# Patient Record
Sex: Male | Born: 1945 | Race: White | Hispanic: No | Marital: Married
Health system: Southern US, Community
[De-identification: ages and names within clinical notes are randomized; demographics above are authoritative.]

---

## 2004-11-09 ENCOUNTER — Emergency Department: Payer: Self-pay | Admitting: Emergency Medicine

## 2005-08-24 ENCOUNTER — Emergency Department: Payer: Self-pay | Admitting: Emergency Medicine

## 2005-08-24 ENCOUNTER — Other Ambulatory Visit: Payer: Self-pay

## 2006-06-04 ENCOUNTER — Inpatient Hospital Stay: Payer: Self-pay | Admitting: Internal Medicine

## 2006-06-04 ENCOUNTER — Other Ambulatory Visit: Payer: Self-pay

## 2006-07-25 ENCOUNTER — Emergency Department: Payer: Self-pay | Admitting: Emergency Medicine

## 2006-08-28 ENCOUNTER — Emergency Department: Payer: Self-pay | Admitting: Emergency Medicine

## 2006-09-13 ENCOUNTER — Inpatient Hospital Stay: Payer: Self-pay | Admitting: Internal Medicine

## 2006-09-13 ENCOUNTER — Other Ambulatory Visit: Payer: Self-pay

## 2006-09-15 ENCOUNTER — Other Ambulatory Visit: Payer: Self-pay

## 2006-11-15 ENCOUNTER — Other Ambulatory Visit: Payer: Self-pay

## 2006-11-15 ENCOUNTER — Inpatient Hospital Stay: Payer: Self-pay | Admitting: Internal Medicine

## 2007-10-05 ENCOUNTER — Emergency Department: Payer: Self-pay | Admitting: Emergency Medicine

## 2008-06-16 ENCOUNTER — Emergency Department: Payer: Self-pay | Admitting: Emergency Medicine

## 2009-01-22 ENCOUNTER — Inpatient Hospital Stay: Payer: Self-pay | Admitting: Internal Medicine

## 2009-04-21 ENCOUNTER — Inpatient Hospital Stay: Payer: Self-pay | Admitting: Internal Medicine

## 2009-05-14 ENCOUNTER — Ambulatory Visit: Payer: Self-pay | Admitting: Gastroenterology

## 2009-06-18 ENCOUNTER — Ambulatory Visit: Payer: Self-pay | Admitting: Gastroenterology

## 2009-07-03 ENCOUNTER — Ambulatory Visit: Payer: Self-pay | Admitting: Cardiology

## 2009-07-06 ENCOUNTER — Ambulatory Visit: Payer: Self-pay | Admitting: Cardiology

## 2009-08-16 ENCOUNTER — Ambulatory Visit: Payer: Self-pay | Admitting: Otolaryngology

## 2009-08-21 ENCOUNTER — Ambulatory Visit: Payer: Self-pay | Admitting: Ophthalmology

## 2009-08-27 ENCOUNTER — Inpatient Hospital Stay: Payer: Self-pay | Admitting: Internal Medicine

## 2009-09-13 ENCOUNTER — Ambulatory Visit: Payer: Self-pay | Admitting: Family

## 2009-10-11 ENCOUNTER — Ambulatory Visit: Payer: Self-pay | Admitting: Ophthalmology

## 2009-10-11 ENCOUNTER — Ambulatory Visit: Payer: Self-pay | Admitting: Family

## 2009-10-16 ENCOUNTER — Ambulatory Visit: Payer: Self-pay | Admitting: Ophthalmology

## 2009-11-30 ENCOUNTER — Inpatient Hospital Stay: Payer: Self-pay | Admitting: *Deleted

## 2010-03-15 ENCOUNTER — Emergency Department: Payer: Self-pay | Admitting: Unknown Physician Specialty

## 2010-04-12 ENCOUNTER — Ambulatory Visit: Payer: Self-pay | Admitting: Family

## 2010-08-10 ENCOUNTER — Inpatient Hospital Stay: Payer: Self-pay | Admitting: *Deleted

## 2011-04-08 ENCOUNTER — Inpatient Hospital Stay: Payer: Self-pay | Admitting: Internal Medicine

## 2011-04-08 LAB — PRO B NATRIURETIC PEPTIDE: B-Type Natriuretic Peptide: 3289 pg/mL — ABNORMAL HIGH (ref 0–125)

## 2011-04-08 LAB — CK TOTAL AND CKMB (NOT AT ARMC)
CK, Total: 102 U/L (ref 35–232)
CK, Total: 87 U/L (ref 35–232)
CK-MB: 2.8 ng/mL (ref 0.5–3.6)
CK-MB: 2.6 ng/mL (ref 0.5–3.6)

## 2011-04-08 LAB — TROPONIN I: Troponin-I: 0.03 ng/mL

## 2011-04-08 LAB — APTT: Activated PTT: 36 secs — ABNORMAL HIGH (ref 23.6–35.9)

## 2011-04-08 LAB — COMPREHENSIVE METABOLIC PANEL
Albumin: 3.3 g/dL — ABNORMAL LOW (ref 3.4–5.0)
Alkaline Phosphatase: 153 U/L — ABNORMAL HIGH (ref 50–136)
BUN: 61 mg/dL — ABNORMAL HIGH (ref 7–18)
Co2: 19 mmol/L — ABNORMAL LOW (ref 21–32)
Creatinine: 2.11 mg/dL — ABNORMAL HIGH (ref 0.60–1.30)
EGFR (Non-African Amer.): 33 — ABNORMAL LOW
Glucose: 123 mg/dL — ABNORMAL HIGH (ref 65–99)
Osmolality: 300 (ref 275–301)
Potassium: 4.2 mmol/L (ref 3.5–5.1)
SGOT(AST): 62 U/L — ABNORMAL HIGH (ref 15–37)
Sodium: 141 mmol/L (ref 136–145)
Total Protein: 7.2 g/dL (ref 6.4–8.2)

## 2011-04-08 LAB — CBC
MCHC: 33.6 g/dL (ref 32.0–36.0)
MCV: 99 fL (ref 80–100)
RBC: 3.19 10*6/uL — ABNORMAL LOW (ref 4.40–5.90)
RDW: 17.8 % — ABNORMAL HIGH (ref 11.5–14.5)
WBC: 4.5 10*3/uL (ref 3.8–10.6)

## 2011-04-08 LAB — TSH: Thyroid Stimulating Horm: 2.21 u[IU]/mL

## 2011-04-08 LAB — PROTIME-INR: INR: 1

## 2011-04-09 LAB — COMPREHENSIVE METABOLIC PANEL
Alkaline Phosphatase: 129 U/L (ref 50–136)
Anion Gap: 12 (ref 7–16)
Co2: 18 mmol/L — ABNORMAL LOW (ref 21–32)
Creatinine: 1.72 mg/dL — ABNORMAL HIGH (ref 0.60–1.30)
Glucose: 82 mg/dL (ref 65–99)
Osmolality: 298 (ref 275–301)
Potassium: 4.3 mmol/L (ref 3.5–5.1)
SGOT(AST): 55 U/L — ABNORMAL HIGH (ref 15–37)
Sodium: 142 mmol/L (ref 136–145)

## 2011-04-09 LAB — CBC WITH DIFFERENTIAL/PLATELET
Basophil #: 0 10*3/uL (ref 0.0–0.1)
Eosinophil %: 7 %
Lymphocyte %: 29.2 %
MCH: 33.5 pg (ref 26.0–34.0)
MCHC: 34.3 g/dL (ref 32.0–36.0)
Monocyte #: 0.5 10*3/uL (ref 0.0–0.7)
Neutrophil %: 51.3 %
RDW: 17.8 % — ABNORMAL HIGH (ref 11.5–14.5)
WBC: 4.3 10*3/uL (ref 3.8–10.6)

## 2011-04-09 LAB — HEMOGLOBIN A1C: Hemoglobin A1C: 6.4 % — ABNORMAL HIGH (ref 4.2–6.3)

## 2011-04-09 LAB — LIPID PANEL
Cholesterol: 115 mg/dL (ref 0–200)
HDL Cholesterol: 21 mg/dL — ABNORMAL LOW (ref 40–60)

## 2011-05-28 ENCOUNTER — Inpatient Hospital Stay: Payer: Self-pay | Admitting: Internal Medicine

## 2011-05-28 LAB — URINALYSIS, COMPLETE
Bilirubin,UR: NEGATIVE
Glucose,UR: NEGATIVE mg/dL (ref 0–75)
Hyaline Cast: 7
Ketone: NEGATIVE
Leukocyte Esterase: NEGATIVE
Nitrite: NEGATIVE
Ph: 6 (ref 4.5–8.0)
Specific Gravity: 1.006 (ref 1.003–1.030)
Squamous Epithelial: 3
WBC UR: 1 /HPF (ref 0–5)

## 2011-05-28 LAB — CBC
HCT: 31.5 % — ABNORMAL LOW (ref 40.0–52.0)
HGB: 10.5 g/dL — ABNORMAL LOW (ref 13.0–18.0)
MCH: 34.2 pg — ABNORMAL HIGH (ref 26.0–34.0)
Platelet: 109 10*3/uL — ABNORMAL LOW (ref 150–440)
RBC: 3.06 10*6/uL — ABNORMAL LOW (ref 4.40–5.90)
WBC: 6.5 10*3/uL (ref 3.8–10.6)

## 2011-05-28 LAB — BASIC METABOLIC PANEL
Anion Gap: 11 (ref 7–16)
BUN: 89 mg/dL — ABNORMAL HIGH (ref 7–18)
Calcium, Total: 8.6 mg/dL (ref 8.5–10.1)
Co2: 22 mmol/L (ref 21–32)
EGFR (African American): 22 — ABNORMAL LOW
Glucose: 71 mg/dL (ref 65–99)
Osmolality: 305 (ref 275–301)

## 2011-05-28 LAB — PROTIME-INR
INR: 1.1
Prothrombin Time: 14.6 secs (ref 11.5–14.7)

## 2011-05-29 LAB — BASIC METABOLIC PANEL
Anion Gap: 11 (ref 7–16)
Calcium, Total: 8.6 mg/dL (ref 8.5–10.1)
Co2: 21 mmol/L (ref 21–32)
Creatinine: 3.13 mg/dL — ABNORMAL HIGH (ref 0.60–1.30)
EGFR (African American): 23 — ABNORMAL LOW
EGFR (Non-African Amer.): 20 — ABNORMAL LOW
Osmolality: 309 (ref 275–301)
Sodium: 141 mmol/L (ref 136–145)

## 2011-05-29 LAB — TROPONIN I: Troponin-I: 0.02 ng/mL

## 2011-05-30 LAB — COMPREHENSIVE METABOLIC PANEL
Albumin: 2.8 g/dL — ABNORMAL LOW (ref 3.4–5.0)
Alkaline Phosphatase: 165 U/L — ABNORMAL HIGH (ref 50–136)
BUN: 64 mg/dL — ABNORMAL HIGH (ref 7–18)
Calcium, Total: 8.6 mg/dL (ref 8.5–10.1)
Co2: 18 mmol/L — ABNORMAL LOW (ref 21–32)
Creatinine: 2.46 mg/dL — ABNORMAL HIGH (ref 0.60–1.30)
Glucose: 189 mg/dL — ABNORMAL HIGH (ref 65–99)
Potassium: 4.3 mmol/L (ref 3.5–5.1)
Total Protein: 7.2 g/dL (ref 6.4–8.2)

## 2011-07-27 ENCOUNTER — Emergency Department: Payer: Self-pay | Admitting: Internal Medicine

## 2011-07-27 LAB — CBC
HGB: 10.6 g/dL — ABNORMAL LOW (ref 13.0–18.0)
MCH: 33.8 pg (ref 26.0–34.0)
Platelet: 120 10*3/uL — ABNORMAL LOW (ref 150–440)
RBC: 3.14 10*6/uL — ABNORMAL LOW (ref 4.40–5.90)
WBC: 5 10*3/uL (ref 3.8–10.6)

## 2011-07-27 LAB — COMPREHENSIVE METABOLIC PANEL
Alkaline Phosphatase: 202 U/L — ABNORMAL HIGH (ref 50–136)
BUN: 13 mg/dL (ref 7–18)
Chloride: 105 mmol/L (ref 98–107)
Co2: 32 mmol/L (ref 21–32)
Creatinine: 1.22 mg/dL (ref 0.60–1.30)
EGFR (African American): >60
EGFR (Non-African Amer.): >60
Glucose: 105 mg/dL — ABNORMAL HIGH (ref 65–99)
SGOT(AST): 37 U/L (ref 15–37)
SGPT (ALT): 21 U/L
Total Protein: 7.4 g/dL (ref 6.4–8.2)

## 2011-07-27 LAB — TROPONIN I: Troponin-I: 0.04 ng/mL

## 2011-08-28 ENCOUNTER — Inpatient Hospital Stay: Payer: Self-pay | Admitting: Internal Medicine

## 2011-08-28 LAB — PRO B NATRIURETIC PEPTIDE: B-Type Natriuretic Peptide: 1992 pg/mL — ABNORMAL HIGH

## 2011-08-28 LAB — CBC
HCT: 32.4 % — ABNORMAL LOW (ref 40.0–52.0)
HGB: 11.1 g/dL — ABNORMAL LOW (ref 13.0–18.0)
MCH: 33.1 pg (ref 26.0–34.0)
MCHC: 34.2 g/dL (ref 32.0–36.0)
MCV: 97 fL (ref 80–100)
RDW: 14.7 % — ABNORMAL HIGH (ref 11.5–14.5)

## 2011-08-28 LAB — BASIC METABOLIC PANEL WITH GFR
Anion Gap: 6 — ABNORMAL LOW
BUN: 23 mg/dL — ABNORMAL HIGH
Calcium, Total: 9.3 mg/dL
Chloride: 105 mmol/L
Co2: 29 mmol/L
Creatinine: 1.42 mg/dL — ABNORMAL HIGH
EGFR (African American): 58 — ABNORMAL LOW
EGFR (Non-African Amer.): 50 — ABNORMAL LOW
Glucose: 107 mg/dL — ABNORMAL HIGH
Osmolality: 284
Potassium: 4.5 mmol/L
Sodium: 140 mmol/L

## 2011-08-28 LAB — CK TOTAL AND CKMB (NOT AT ARMC)
CK, Total: 36 U/L
CK, Total: 41 U/L (ref 35–232)
CK-MB: 1.1 ng/mL (ref 0.5–3.6)
CK-MB: 1 ng/mL
CK-MB: 1.5 ng/mL (ref 0.5–3.6)

## 2011-08-28 LAB — TROPONIN I
Troponin-I: <0.02 ng/mL
Troponin-I: <0.02 ng/mL

## 2011-08-29 LAB — CBC WITH DIFFERENTIAL/PLATELET
Basophil #: 0 10*3/uL (ref 0.0–0.1)
Basophil %: 0.7 %
Eosinophil %: 6.3 %
HCT: 33.3 % — ABNORMAL LOW (ref 40.0–52.0)
HGB: 11 g/dL — ABNORMAL LOW (ref 13.0–18.0)
Lymphocyte #: 1.4 10*3/uL (ref 1.0–3.6)
Lymphocyte %: 25.4 %
MCH: 32.2 pg (ref 26.0–34.0)
MCV: 98 fL (ref 80–100)
Monocyte #: 0.6 x10 3/mm (ref 0.2–1.0)
Monocyte %: 9.8 %
Neutrophil #: 3.3 10*3/uL (ref 1.4–6.5)
Neutrophil %: 57.8 %
Platelet: 125 10*3/uL — ABNORMAL LOW (ref 150–440)
RBC: 3.41 10*6/uL — ABNORMAL LOW (ref 4.40–5.90)
WBC: 5.7 10*3/uL (ref 3.8–10.6)

## 2011-08-29 LAB — CK TOTAL AND CKMB (NOT AT ARMC)
CK, Total: 39 U/L (ref 35–232)
CK-MB: 1.3 ng/mL (ref 0.5–3.6)

## 2011-08-29 LAB — BASIC METABOLIC PANEL
Anion Gap: 8 (ref 7–16)
Calcium, Total: 9.1 mg/dL (ref 8.5–10.1)
Chloride: 106 mmol/L (ref 98–107)
Co2: 27 mmol/L (ref 21–32)
Creatinine: 1.44 mg/dL — ABNORMAL HIGH (ref 0.60–1.30)
EGFR (African American): 57 — ABNORMAL LOW
Potassium: 4.3 mmol/L (ref 3.5–5.1)

## 2011-08-29 LAB — TROPONIN I: Troponin-I: 0.03 ng/mL

## 2011-10-22 ENCOUNTER — Inpatient Hospital Stay: Payer: Self-pay | Admitting: Internal Medicine

## 2011-10-22 LAB — COMPREHENSIVE METABOLIC PANEL
Albumin: 2.8 g/dL — ABNORMAL LOW (ref 3.4–5.0)
Alkaline Phosphatase: 169 U/L — ABNORMAL HIGH (ref 50–136)
Anion Gap: 8 (ref 7–16)
BUN: 28 mg/dL — ABNORMAL HIGH (ref 7–18)
Bilirubin,Total: 0.5 mg/dL (ref 0.2–1.0)
Calcium, Total: 8.4 mg/dL — ABNORMAL LOW (ref 8.5–10.1)
Chloride: 113 mmol/L — ABNORMAL HIGH (ref 98–107)
Creatinine: 1.61 mg/dL — ABNORMAL HIGH (ref 0.60–1.30)
EGFR (African American): 50 — ABNORMAL LOW
EGFR (Non-African Amer.): 43 — ABNORMAL LOW
Glucose: 112 mg/dL — ABNORMAL HIGH (ref 65–99)
Osmolality: 297 (ref 275–301)
SGPT (ALT): 29 U/L (ref 12–78)
Total Protein: 7.2 g/dL (ref 6.4–8.2)

## 2011-10-22 LAB — CBC WITH DIFFERENTIAL/PLATELET
Basophil #: 0.1 10*3/uL (ref 0.0–0.1)
Eosinophil %: 6.6 %
HCT: 28.8 % — ABNORMAL LOW (ref 40.0–52.0)
HGB: 10.1 g/dL — ABNORMAL LOW (ref 13.0–18.0)
Lymphocyte #: 1 10*3/uL (ref 1.0–3.6)
MCH: 33.8 pg (ref 26.0–34.0)
MCV: 97 fL (ref 80–100)
Monocyte #: 0.7 x10 3/mm (ref 0.2–1.0)
Neutrophil #: 2.7 10*3/uL (ref 1.4–6.5)
Platelet: 129 10*3/uL — ABNORMAL LOW (ref 150–440)
RBC: 2.97 10*6/uL — ABNORMAL LOW (ref 4.40–5.90)
RDW: 17.1 % — ABNORMAL HIGH (ref 11.5–14.5)

## 2011-10-22 LAB — PRO B NATRIURETIC PEPTIDE: B-Type Natriuretic Peptide: 3353 pg/mL — ABNORMAL HIGH (ref 0–125)

## 2011-10-22 LAB — CK TOTAL AND CKMB (NOT AT ARMC): CK-MB: 1.3 ng/mL (ref 0.5–3.6)

## 2011-10-23 LAB — COMPREHENSIVE METABOLIC PANEL
Albumin: 2.8 g/dL — ABNORMAL LOW (ref 3.4–5.0)
Alkaline Phosphatase: 173 U/L — ABNORMAL HIGH (ref 50–136)
Anion Gap: 8 (ref 7–16)
Bilirubin,Total: 0.6 mg/dL (ref 0.2–1.0)
Co2: 23 mmol/L (ref 21–32)
Creatinine: 1.67 mg/dL — ABNORMAL HIGH (ref 0.60–1.30)
EGFR (African American): 48 — ABNORMAL LOW
EGFR (Non-African Amer.): 41 — ABNORMAL LOW
Glucose: 63 mg/dL — ABNORMAL LOW (ref 65–99)
Osmolality: 289 (ref 275–301)
SGOT(AST): 42 U/L — ABNORMAL HIGH (ref 15–37)
SGPT (ALT): 30 U/L (ref 12–78)
Sodium: 143 mmol/L (ref 136–145)

## 2011-10-23 LAB — CBC WITH DIFFERENTIAL/PLATELET
Basophil #: 0.1 10*3/uL (ref 0.0–0.1)
Basophil %: 1.1 %
Eosinophil %: 4.8 %
HCT: 29.5 % — ABNORMAL LOW (ref 40.0–52.0)
HGB: 9.9 g/dL — ABNORMAL LOW (ref 13.0–18.0)
Lymphocyte #: 1.1 10*3/uL (ref 1.0–3.6)
MCH: 32.2 pg (ref 26.0–34.0)
Monocyte %: 11.3 %
Neutrophil #: 4 10*3/uL (ref 1.4–6.5)
RBC: 3.08 10*6/uL — ABNORMAL LOW (ref 4.40–5.90)
WBC: 6.2 10*3/uL (ref 3.8–10.6)

## 2011-10-23 LAB — CK TOTAL AND CKMB (NOT AT ARMC)
CK, Total: 40 U/L (ref 35–232)
CK-MB: 1.3 ng/mL (ref 0.5–3.6)

## 2011-10-23 LAB — TROPONIN I: Troponin-I: 0.04 ng/mL

## 2011-10-23 LAB — MAGNESIUM: Magnesium: 1.8 mg/dL

## 2011-10-24 LAB — MAGNESIUM: Magnesium: 1.7 mg/dL — ABNORMAL LOW

## 2011-10-24 LAB — BASIC METABOLIC PANEL
BUN: 32 mg/dL — ABNORMAL HIGH (ref 7–18)
Calcium, Total: 8.7 mg/dL (ref 8.5–10.1)
Chloride: 103 mmol/L (ref 98–107)
Co2: 26 mmol/L (ref 21–32)
Creatinine: 1.66 mg/dL — ABNORMAL HIGH (ref 0.60–1.30)
EGFR (African American): 48 — ABNORMAL LOW
EGFR (Non-African Amer.): 42 — ABNORMAL LOW
Osmolality: 285 (ref 275–301)
Sodium: 140 mmol/L (ref 136–145)

## 2011-10-25 LAB — BASIC METABOLIC PANEL
Anion Gap: 9 (ref 7–16)
Calcium, Total: 8.6 mg/dL (ref 8.5–10.1)
Co2: 27 mmol/L (ref 21–32)
Creatinine: 1.92 mg/dL — ABNORMAL HIGH (ref 0.60–1.30)
EGFR (African American): 41 — ABNORMAL LOW
EGFR (Non-African Amer.): 35 — ABNORMAL LOW
Osmolality: 290 (ref 275–301)

## 2011-10-26 LAB — BASIC METABOLIC PANEL
Anion Gap: 9 (ref 7–16)
Calcium, Total: 8.8 mg/dL (ref 8.5–10.1)
Chloride: 105 mmol/L (ref 98–107)
Co2: 27 mmol/L (ref 21–32)
Creatinine: 1.79 mg/dL — ABNORMAL HIGH (ref 0.60–1.30)
Potassium: 3.9 mmol/L (ref 3.5–5.1)

## 2011-10-27 LAB — COMPREHENSIVE METABOLIC PANEL
Anion Gap: 10 (ref 7–16)
BUN: 34 mg/dL — ABNORMAL HIGH (ref 7–18)
Bilirubin,Total: 0.6 mg/dL (ref 0.2–1.0)
Chloride: 103 mmol/L (ref 98–107)
Co2: 26 mmol/L (ref 21–32)
EGFR (African American): 48 — ABNORMAL LOW
Glucose: 102 mg/dL — ABNORMAL HIGH (ref 65–99)
Potassium: 3.8 mmol/L (ref 3.5–5.1)
Total Protein: 6.9 g/dL (ref 6.4–8.2)

## 2011-10-27 LAB — PLATELET COUNT: Platelet: 135 10*3/uL — ABNORMAL LOW (ref 150–440)

## 2011-10-27 LAB — MAGNESIUM: Magnesium: 1.9 mg/dL

## 2011-11-27 IMAGING — CT CT STONE STUDY
1 of 2 series · 15 of 32 positions shown, 19 images · non-contrast
Comparison: none

REASON FOR EXAM: intermittent abd pain creat of 4+ and anemia heme neg
stool
COMMENTS:

PROCEDURE:     CT  - CT ABDOMEN /PELVIS WO (STONE)  - January 22, 2009  [DATE]
RESULT:
HISTORY: Pain.

[Series 2: stone · axial · 0.78mm/px · z∈[-528,-150]mm · 15 of 143 slices shown, 19 images]
[im 11/143  soft-tissue]
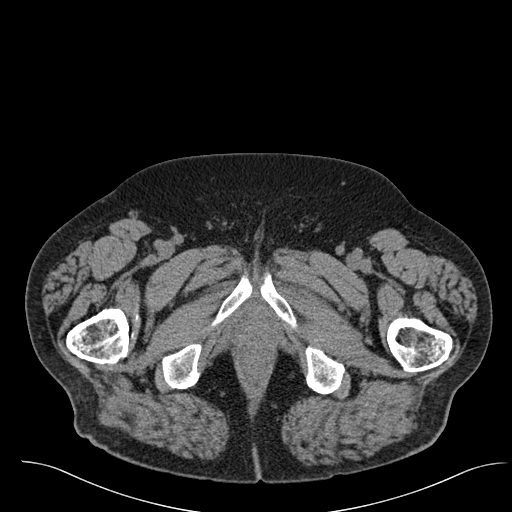
[im 11/143  bone]
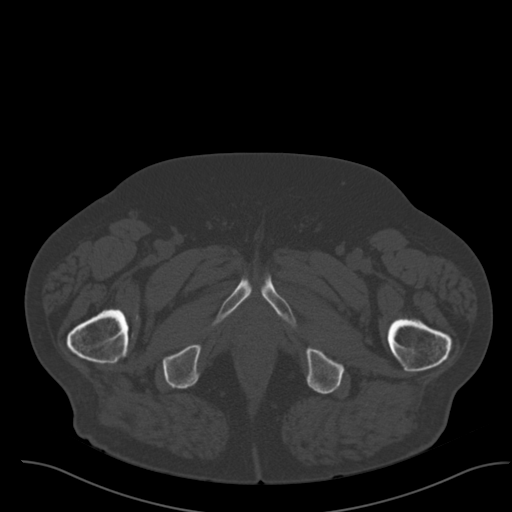
[im 21/143  soft-tissue]
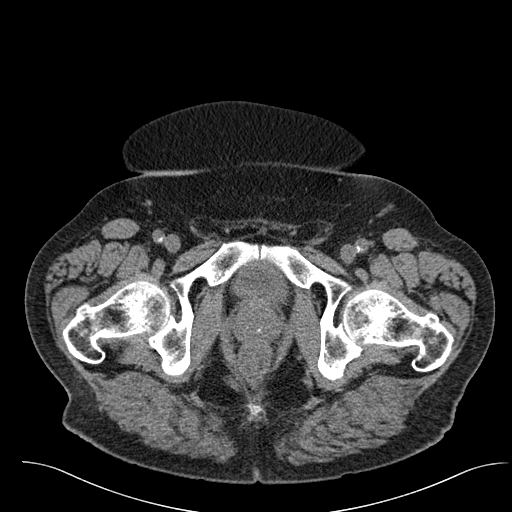
[im 31/143  soft-tissue]
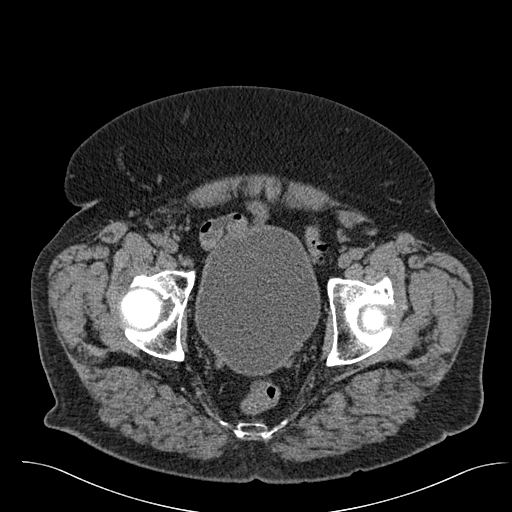
[im 41/143  soft-tissue]
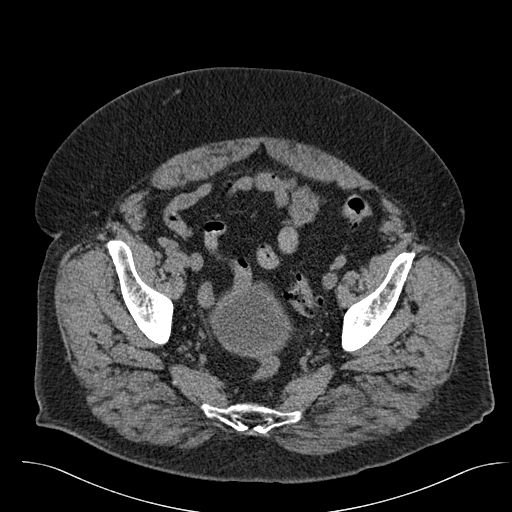
[im 51/143  soft-tissue]
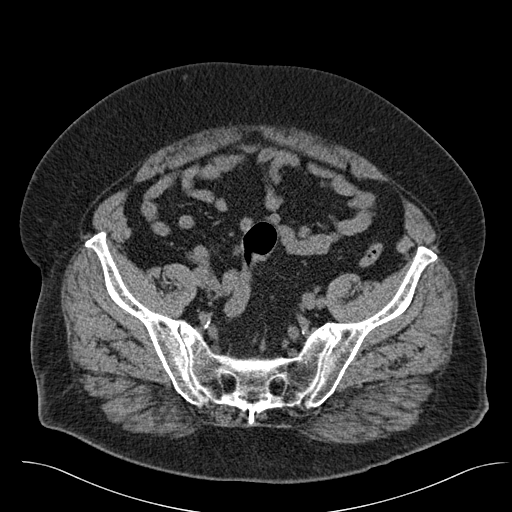
[im 61/143  soft-tissue]
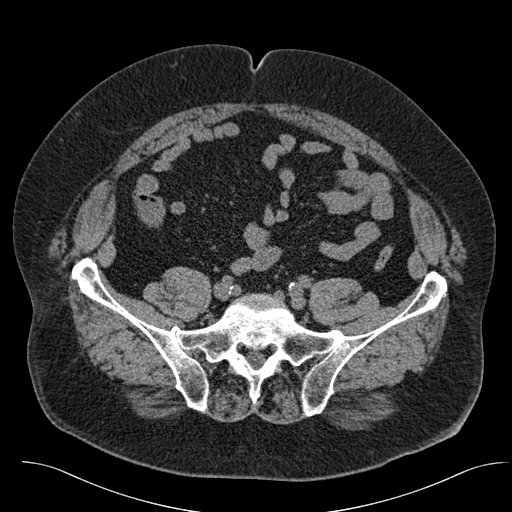
[im 72/143  soft-tissue]
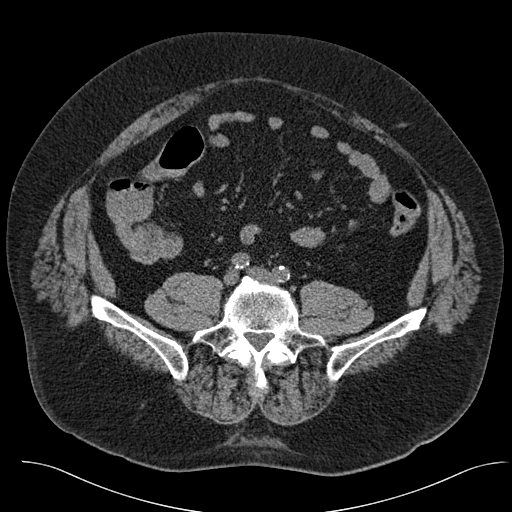
[im 82/143  soft-tissue]
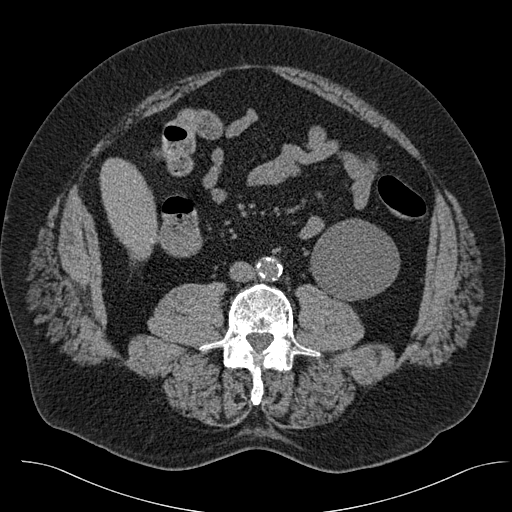
[im 92/143  soft-tissue]
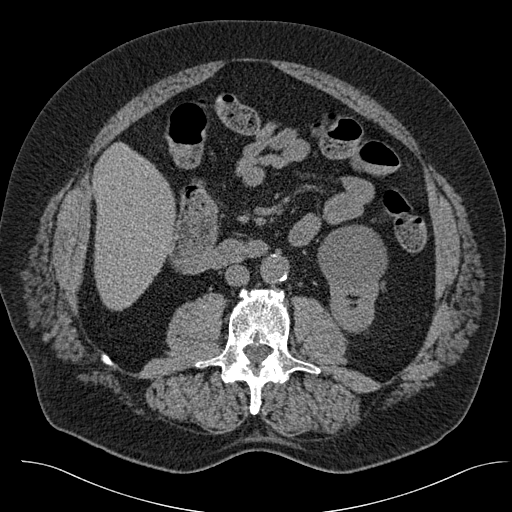
[im 92/143  bone]
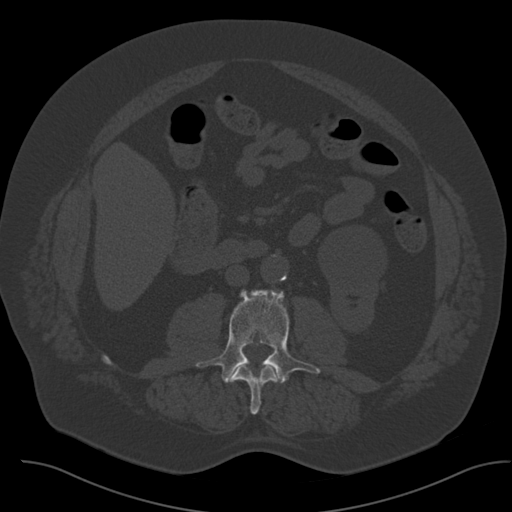
[im 102/143  soft-tissue]
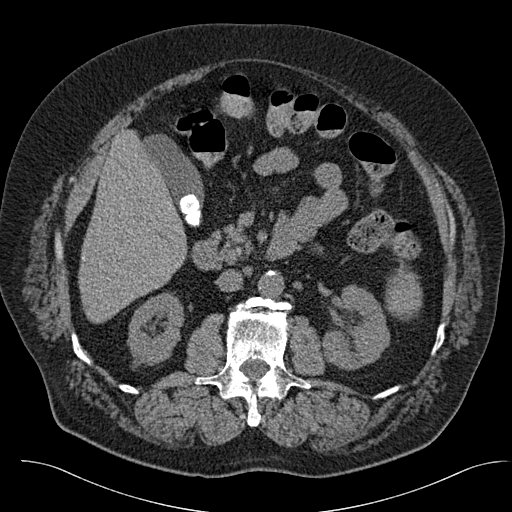
[im 112/143  soft-tissue]
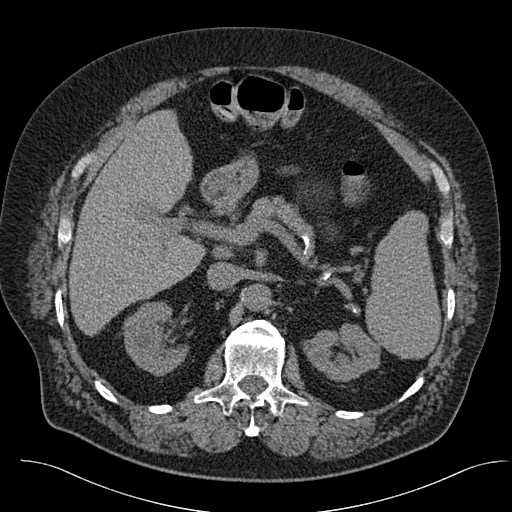
[im 122/143  soft-tissue]
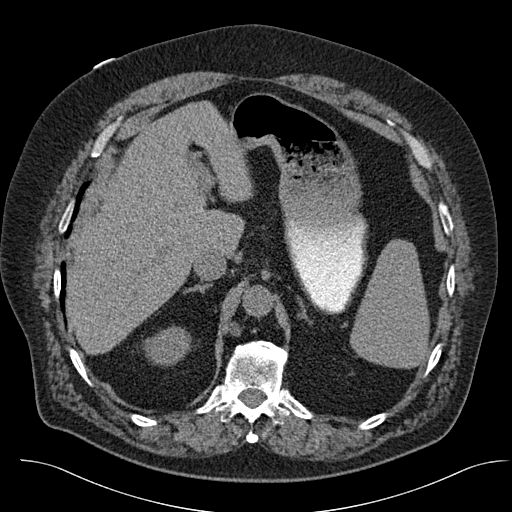
[im 122/143  lung]
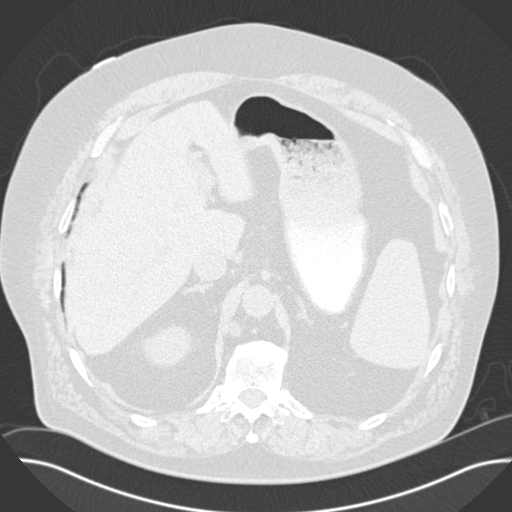
[im 127/143  lung]
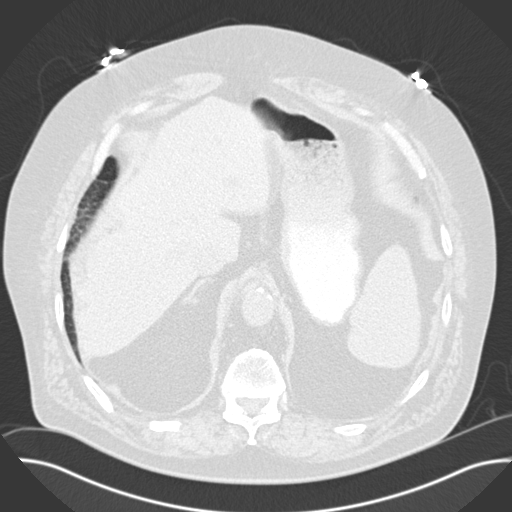
[im 132/143  soft-tissue]
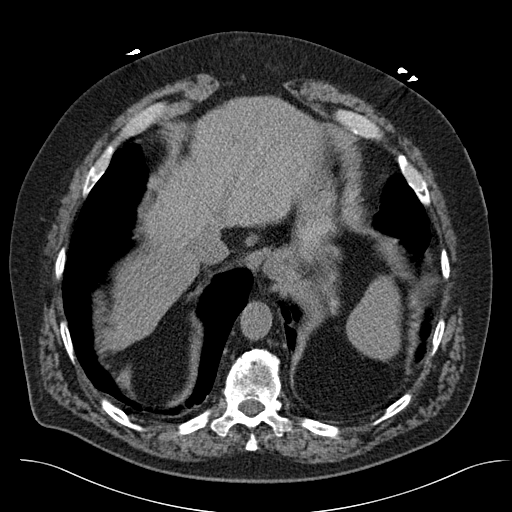
[im 132/143  lung]
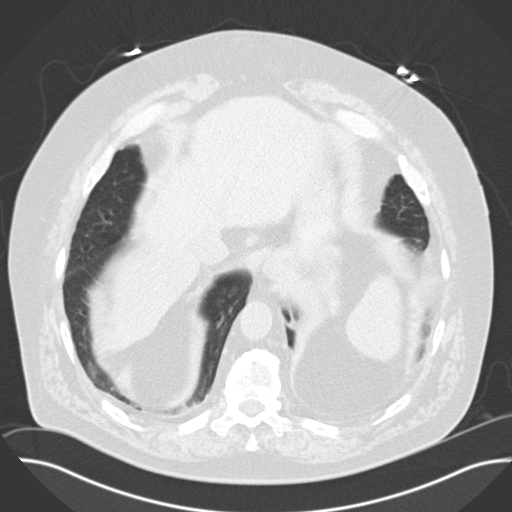
[im 137/143  lung]
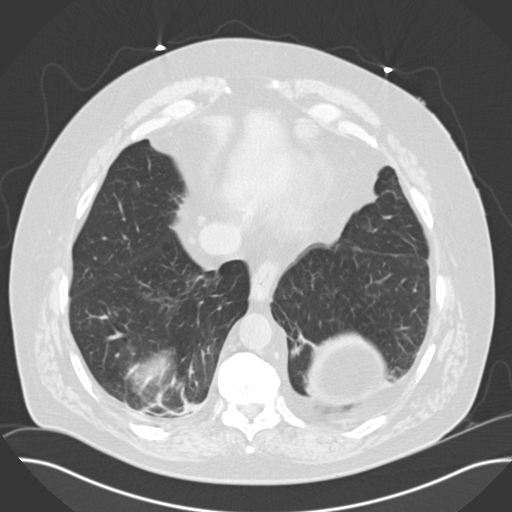

[15 of 32 positions shown; findings below may reference images not displayed]

COMPARISON STUDY:    Bilateral renal lesions are present. These appear to be
cystic and/or hyperdense. These are most likely simple and hyperdense cysts.
To exclude a solid renal lesion, bilateral renal ultrasound is suggested.
The liver and spleen are normal. The pancreas is normal. Adrenals are
unremarkable. Gallstones are present. There is a large 6.0 cm cyst in the
left kidney. No bowel distention. Appendix is normal. There is a
questionable periportal lymph node present. This measures approximately
2.0-3.0 cm. The abdominal aorta is atherosclerotic. No aneurysm. Basilar
atelectasis is present. Pacemaker noted.
IMPRESSION: 1.     Cholelithiasis.
2.     No evidence of acute cholecystitis.
3.     Probable periportal lymph node. Further evaluation with a
contrast-enhanced CT may prove useful.
4.     Multiple bilateral renal lesions. These are too small to characterize
but most likely represent simple and hemorrhagic cysts. Further evaluation
with ultrasound and/or contrast enhanced CT is suggested. There is a large
6.0 cm left renal cyst.

## 2011-12-13 ENCOUNTER — Inpatient Hospital Stay: Payer: Self-pay | Admitting: Internal Medicine

## 2011-12-13 LAB — CBC
HCT: 29.4 % — ABNORMAL LOW (ref 40.0–52.0)
HGB: 9.7 g/dL — ABNORMAL LOW (ref 13.0–18.0)
MCHC: 32.9 g/dL (ref 32.0–36.0)
MCV: 98 fL (ref 80–100)
Platelet: 136 10*3/uL — ABNORMAL LOW (ref 150–440)

## 2011-12-13 LAB — PRO B NATRIURETIC PEPTIDE: B-Type Natriuretic Peptide: 5429 pg/mL — ABNORMAL HIGH (ref 0–125)

## 2011-12-13 LAB — COMPREHENSIVE METABOLIC PANEL
Albumin: 3 g/dL — ABNORMAL LOW (ref 3.4–5.0)
Anion Gap: 9 (ref 7–16)
BUN: 25 mg/dL — ABNORMAL HIGH (ref 7–18)
Bilirubin,Total: 0.6 mg/dL (ref 0.2–1.0)
Chloride: 110 mmol/L — ABNORMAL HIGH (ref 98–107)
Creatinine: 1.59 mg/dL — ABNORMAL HIGH (ref 0.60–1.30)
EGFR (African American): 51 — ABNORMAL LOW
Glucose: 112 mg/dL — ABNORMAL HIGH (ref 65–99)
Potassium: 4.2 mmol/L (ref 3.5–5.1)
SGPT (ALT): 25 U/L (ref 12–78)
Sodium: 141 mmol/L (ref 136–145)
Total Protein: 7.7 g/dL (ref 6.4–8.2)

## 2011-12-13 LAB — PROTIME-INR
INR: 1.1
Prothrombin Time: 14.6 secs (ref 11.5–14.7)

## 2011-12-13 LAB — CK TOTAL AND CKMB (NOT AT ARMC): CK, Total: 46 U/L (ref 35–232)

## 2011-12-14 LAB — BASIC METABOLIC PANEL
BUN: 27 mg/dL — ABNORMAL HIGH (ref 7–18)
Calcium, Total: 9.2 mg/dL (ref 8.5–10.1)
Chloride: 108 mmol/L — ABNORMAL HIGH (ref 98–107)
Co2: 26 mmol/L (ref 21–32)
Creatinine: 1.64 mg/dL — ABNORMAL HIGH (ref 0.60–1.30)
EGFR (African American): 49 — ABNORMAL LOW
Potassium: 4.6 mmol/L (ref 3.5–5.1)
Sodium: 140 mmol/L (ref 136–145)

## 2011-12-14 LAB — CBC WITH DIFFERENTIAL/PLATELET
Basophil %: 1.1 %
Eosinophil #: 0.4 10*3/uL (ref 0.0–0.7)
Eosinophil %: 5.9 %
HCT: 29.4 % — ABNORMAL LOW (ref 40.0–52.0)
Lymphocyte #: 0.9 10*3/uL — ABNORMAL LOW (ref 1.0–3.6)
Lymphocyte %: 14 %
Monocyte %: 11.6 %
Neutrophil #: 4.1 10*3/uL (ref 1.4–6.5)
Neutrophil %: 67.4 %
Platelet: 145 10*3/uL — ABNORMAL LOW (ref 150–440)
RBC: 3.04 10*6/uL — ABNORMAL LOW (ref 4.40–5.90)
RDW: 16.9 % — ABNORMAL HIGH (ref 11.5–14.5)
WBC: 6.1 10*3/uL (ref 3.8–10.6)

## 2011-12-14 LAB — LIPID PANEL
Cholesterol: 116 mg/dL (ref 0–200)
Ldl Cholesterol, Calc: 71 mg/dL (ref 0–100)
Triglycerides: 109 mg/dL (ref 0–200)
VLDL Cholesterol, Calc: 22 mg/dL (ref 5–40)

## 2011-12-14 LAB — TROPONIN I
Troponin-I: 0.03 ng/mL
Troponin-I: 0.02 ng/mL

## 2011-12-14 LAB — CK TOTAL AND CKMB (NOT AT ARMC)
CK, Total: 35 U/L
CK-MB: 1.5 ng/mL (ref 0.5–3.6)
CK-MB: 1.3 ng/mL

## 2011-12-14 LAB — HEMOGLOBIN A1C: Hemoglobin A1C: 4.7 %

## 2011-12-15 LAB — BASIC METABOLIC PANEL
Anion Gap: 7 (ref 7–16)
Calcium, Total: 8.7 mg/dL (ref 8.5–10.1)
Chloride: 107 mmol/L (ref 98–107)
Co2: 27 mmol/L (ref 21–32)
Creatinine: 1.81 mg/dL — ABNORMAL HIGH (ref 0.60–1.30)
EGFR (African American): 44 — ABNORMAL LOW
EGFR (Non-African Amer.): 38 — ABNORMAL LOW
Osmolality: 287 (ref 275–301)
Potassium: 3.5 mmol/L (ref 3.5–5.1)

## 2011-12-15 LAB — MAGNESIUM: Magnesium: 1.4 mg/dL — ABNORMAL LOW

## 2011-12-16 LAB — BASIC METABOLIC PANEL
Chloride: 106 mmol/L (ref 98–107)
Co2: 27 mmol/L (ref 21–32)
Creatinine: 1.86 mg/dL — ABNORMAL HIGH (ref 0.60–1.30)
EGFR (African American): 42 — ABNORMAL LOW
Glucose: 65 mg/dL (ref 65–99)
Sodium: 140 mmol/L (ref 136–145)

## 2012-05-07 IMAGING — CR DG CHEST 2V
1 series · 2 of 2 positions shown · non-contrast
Comparison: none

REASON FOR EXAM: htn,cad
COMMENTS:

[Series 1: view not recorded · 0.17mm/px · 2 of 2 slices shown]
[im 1/2]
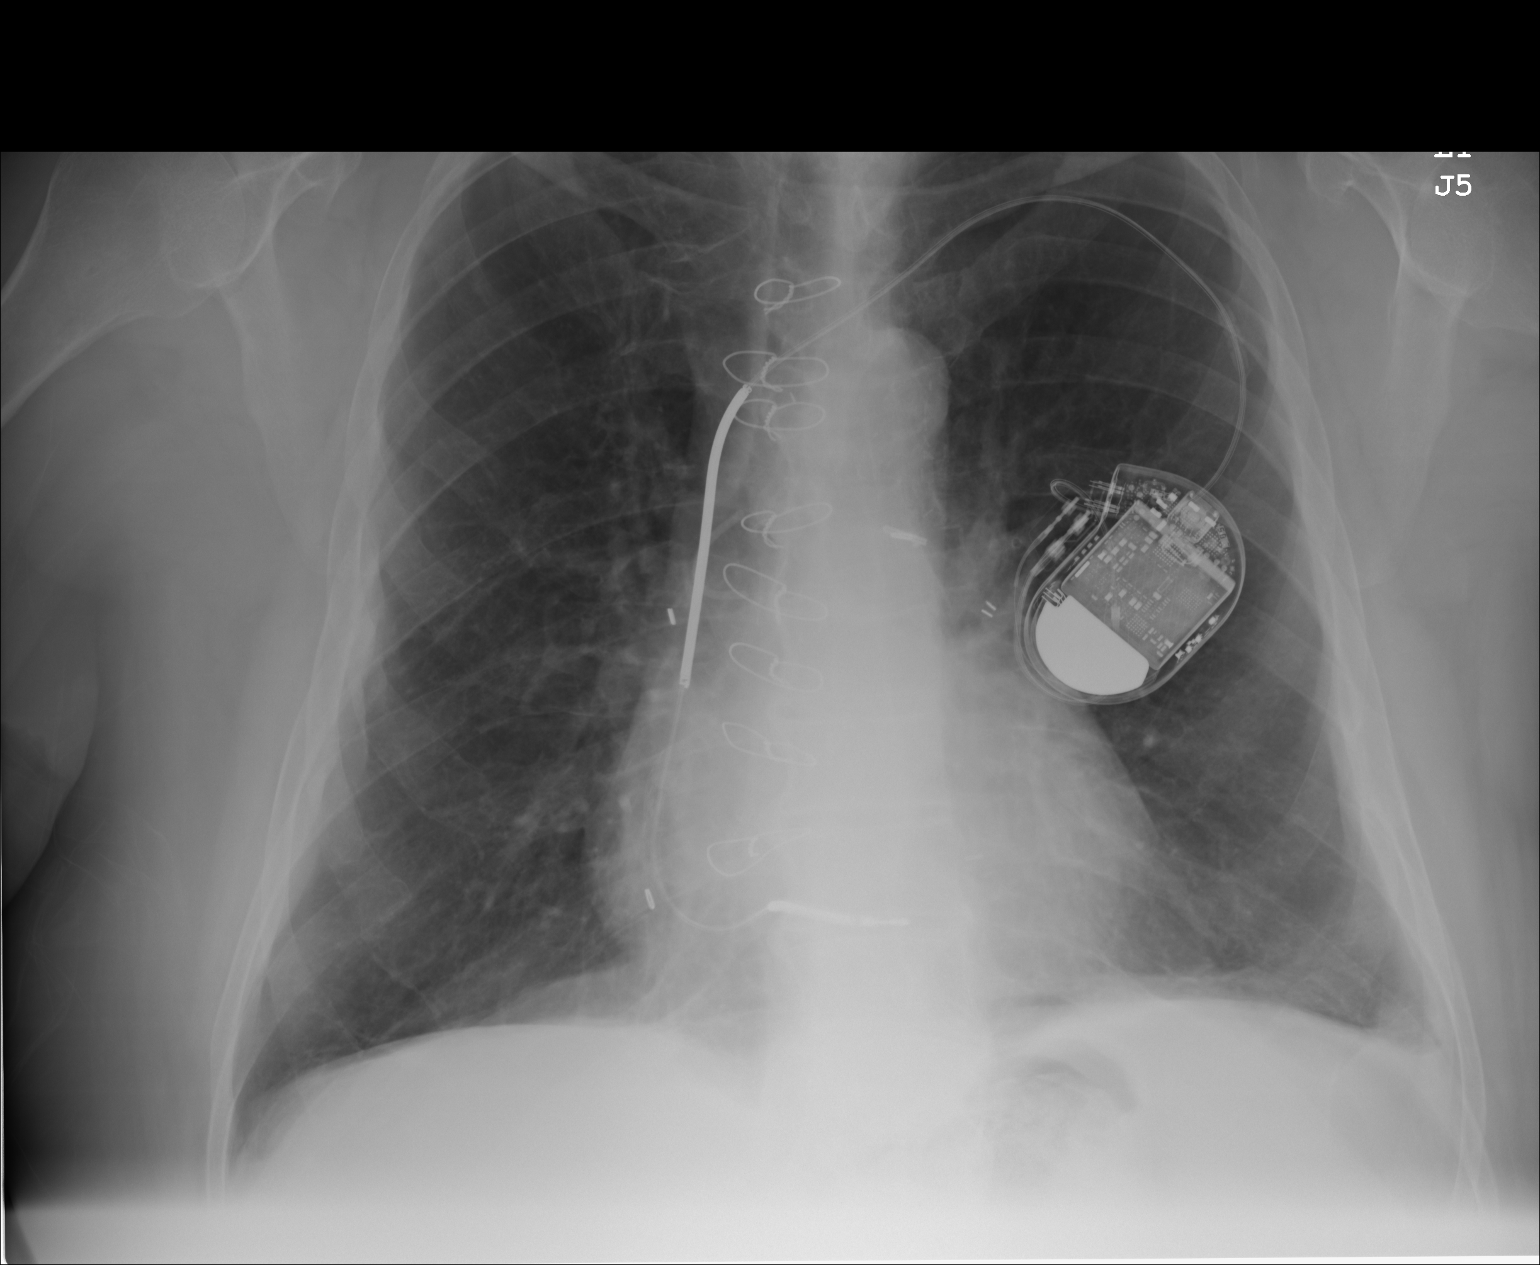
[im 2/2]
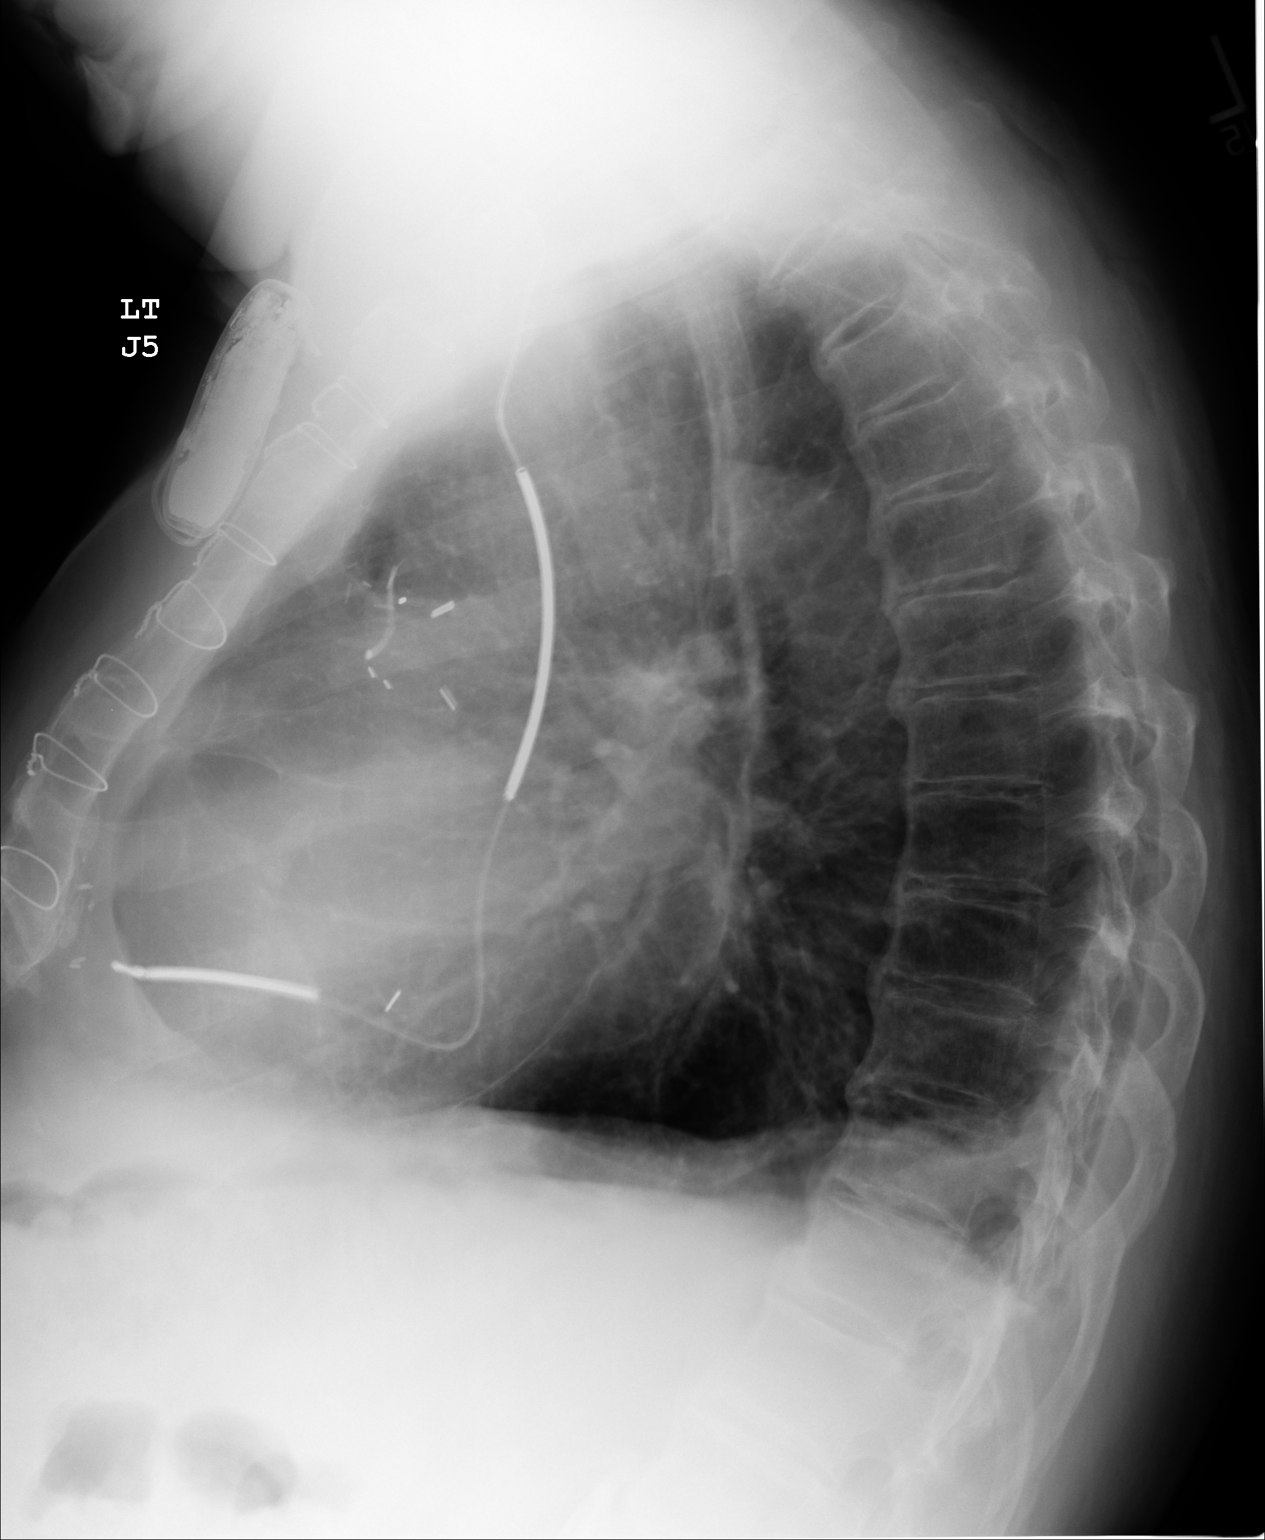

[2 of 2 positions shown; findings below may reference images not displayed]

PROCEDURE:     DXR - DXR CHEST PA (OR AP) AND LATERAL  - July 03, 2009 [DATE]

RESULT:     Comparison is made to a prior exam of 04/20/2009. The current
exam shows the lung fields to be clear of infiltrate. There is very slight
fibrotic change at the left base which is less prominent than on the prior
exam. No new infiltrates are seen. No pleural effusion is noted. Heart size
is normal. Postoperative changes of prior CABG are observed. A cardiac
pacemaker is present. There is no pulmonary edema.
IMPRESSION: 1. No acute changes are identified.
2. Slight fibrotic changes are noted at left base.
3. Heart size remains normal.
4. Cardiac pacemaker is present.

## 2012-06-06 ENCOUNTER — Ambulatory Visit: Payer: Self-pay | Admitting: Internal Medicine

## 2012-06-21 ENCOUNTER — Inpatient Hospital Stay: Payer: Self-pay | Admitting: Internal Medicine

## 2012-06-21 LAB — CBC
HCT: 29.5 % — ABNORMAL LOW (ref 40.0–52.0)
MCH: 33.3 pg (ref 26.0–34.0)
MCHC: 33.8 g/dL (ref 32.0–36.0)
RBC: 2.99 10*6/uL — ABNORMAL LOW (ref 4.40–5.90)
RDW: 15.8 % — ABNORMAL HIGH (ref 11.5–14.5)
WBC: 14.3 10*3/uL — ABNORMAL HIGH (ref 3.8–10.6)

## 2012-06-21 LAB — COMPREHENSIVE METABOLIC PANEL
Albumin: 2.6 g/dL — ABNORMAL LOW (ref 3.4–5.0)
Alkaline Phosphatase: 162 U/L — ABNORMAL HIGH (ref 50–136)
Anion Gap: 9 (ref 7–16)
Calcium, Total: 8.1 mg/dL — ABNORMAL LOW (ref 8.5–10.1)
Chloride: 105 mmol/L (ref 98–107)
EGFR (African American): 25 — ABNORMAL LOW
Osmolality: 280 (ref 275–301)
Potassium: 5 mmol/L (ref 3.5–5.1)
SGOT(AST): 46 U/L — ABNORMAL HIGH (ref 15–37)
SGPT (ALT): 30 U/L (ref 12–78)
Sodium: 135 mmol/L — ABNORMAL LOW (ref 136–145)
Total Protein: 6.1 g/dL — ABNORMAL LOW (ref 6.4–8.2)

## 2012-06-21 LAB — TROPONIN I: Troponin-I: 0.02 ng/mL

## 2012-06-21 LAB — CK TOTAL AND CKMB (NOT AT ARMC): CK-MB: 0.7 ng/mL (ref 0.5–3.6)

## 2012-06-22 LAB — BASIC METABOLIC PANEL
Anion Gap: 6 — ABNORMAL LOW (ref 7–16)
BUN: 52 mg/dL — ABNORMAL HIGH (ref 7–18)
Co2: 24 mmol/L (ref 21–32)
Creatinine: 2.74 mg/dL — ABNORMAL HIGH (ref 0.60–1.30)
EGFR (African American): 26 — ABNORMAL LOW
EGFR (Non-African Amer.): 23 — ABNORMAL LOW
Glucose: 59 mg/dL — ABNORMAL LOW (ref 65–99)
Osmolality: 282 (ref 275–301)
Potassium: 5.4 mmol/L — ABNORMAL HIGH (ref 3.5–5.1)
Sodium: 135 mmol/L — ABNORMAL LOW (ref 136–145)

## 2012-06-22 LAB — URINALYSIS, COMPLETE
Bilirubin,UR: NEGATIVE
Glucose,UR: NEGATIVE mg/dL (ref 0–75)
Ketone: NEGATIVE
Nitrite: NEGATIVE
Ph: 5 (ref 4.5–8.0)
Specific Gravity: 1.01 (ref 1.003–1.030)
WBC UR: 88 /HPF (ref 0–5)

## 2012-06-22 LAB — TSH: Thyroid Stimulating Horm: 0.37 u[IU]/mL — ABNORMAL LOW

## 2012-06-22 LAB — MAGNESIUM: Magnesium: 1.5 mg/dL — ABNORMAL LOW

## 2012-06-22 LAB — LIPID PANEL
HDL Cholesterol: 31 mg/dL — ABNORMAL LOW (ref 40–60)
Ldl Cholesterol, Calc: 41 mg/dL (ref 0–100)
Triglycerides: 90 mg/dL (ref 0–200)
VLDL Cholesterol, Calc: 18 mg/dL (ref 5–40)

## 2012-06-22 LAB — CK TOTAL AND CKMB (NOT AT ARMC)
CK, Total: 71 U/L (ref 35–232)
CK-MB: 1.1 ng/mL (ref 0.5–3.6)

## 2012-06-22 LAB — CLOSTRIDIUM DIFFICILE BY PCR

## 2012-06-23 LAB — BASIC METABOLIC PANEL
Calcium, Total: 8.5 mg/dL (ref 8.5–10.1)
Chloride: 107 mmol/L (ref 98–107)
Co2: 23 mmol/L (ref 21–32)
Creatinine: 2.61 mg/dL — ABNORMAL HIGH (ref 0.60–1.30)
EGFR (African American): 28 — ABNORMAL LOW
EGFR (Non-African Amer.): 24 — ABNORMAL LOW
Glucose: 91 mg/dL (ref 65–99)
Potassium: 4.8 mmol/L (ref 3.5–5.1)
Sodium: 136 mmol/L (ref 136–145)

## 2012-06-23 LAB — CBC WITH DIFFERENTIAL/PLATELET
Basophil #: 0 10*3/uL (ref 0.0–0.1)
Basophil %: 0.1 %
HCT: 29.5 % — ABNORMAL LOW (ref 40.0–52.0)
MCH: 33.6 pg (ref 26.0–34.0)
MCHC: 34 g/dL (ref 32.0–36.0)
MCV: 99 fL (ref 80–100)
Monocyte #: 1 x10 3/mm (ref 0.2–1.0)
Monocyte %: 8 %
Neutrophil %: 84.6 %
Platelet: 117 10*3/uL — ABNORMAL LOW (ref 150–440)
RBC: 2.99 10*6/uL — ABNORMAL LOW (ref 4.40–5.90)
RDW: 15.6 % — ABNORMAL HIGH (ref 11.5–14.5)

## 2012-06-24 LAB — CBC WITH DIFFERENTIAL/PLATELET
Basophil #: 0 10*3/uL (ref 0.0–0.1)
Basophil %: 0.3 %
Eosinophil #: 0.1 10*3/uL (ref 0.0–0.7)
Eosinophil %: 0.5 %
HCT: 27.6 % — ABNORMAL LOW (ref 40.0–52.0)
HGB: 9.5 g/dL — ABNORMAL LOW (ref 13.0–18.0)
Lymphocyte %: 6 %
MCH: 33.5 pg (ref 26.0–34.0)
Monocyte %: 9.3 %
Platelet: 111 10*3/uL — ABNORMAL LOW (ref 150–440)
WBC: 12.6 10*3/uL — ABNORMAL HIGH (ref 3.8–10.6)

## 2012-06-24 LAB — BASIC METABOLIC PANEL
BUN: 50 mg/dL — ABNORMAL HIGH (ref 7–18)
Calcium, Total: 8.7 mg/dL (ref 8.5–10.1)
Chloride: 107 mmol/L (ref 98–107)
Creatinine: 2.43 mg/dL — ABNORMAL HIGH (ref 0.60–1.30)
Glucose: 135 mg/dL — ABNORMAL HIGH (ref 65–99)
Osmolality: 289 (ref 275–301)
Potassium: 4.3 mmol/L (ref 3.5–5.1)
Sodium: 137 mmol/L (ref 136–145)

## 2012-06-29 LAB — CULTURE, BLOOD (SINGLE)

## 2012-07-06 DEATH — deceased

## 2012-11-06 ENCOUNTER — Ambulatory Visit: Payer: Self-pay | Admitting: Internal Medicine

## 2014-04-25 NOTE — Discharge Summary (Signed)
PATIENT NAME:  Kevin Huynh, Kevin Huynh MR#:  [id number] DATE OF BIRTH:  09-21-1945  DATE OF ADMISSION:  08/28/2011 DATE OF DISCHARGE:  08/29/2011  PRIMARY CARE PHYSICIAN: Yves DillNeelam Khan, MD  DISCHARGE DIAGNOSES:  1. Bilateral lower lobe pulmonary embolism.  2. Palpitations. 3. Chronic systolic congestive heart failure with an ejection fraction 25% to 30%.  4. Left bundle branch block.  5. Chronic kidney disease, stage III.  6. Chronic mild anemia.   IMAGING STUDIES: V/Q scan showed intermediate probability in bilateral lower lungs.   SIGNIFICANT LAB STUDIES: D-dimer elevated at 2.99.   ADMITTING HISTORY AND PHYSICAL: Please see detailed history and physical dictated on 08/28/2011. In brief, this is a 69 year old Caucasian male patient with history of coronary artery disease status post coronary artery bypass graft, ischemic cardiomyopathy of 25 to 35% and CKD stage III who presented to the Emergency Room complaining of palpitations and left-sided chest pain. The patient had a d-dimer checked which was 2.99 and then had a V/Q scan which showed intermediate probability for PE in bilateral lower lobes and was admitted for further treatment.   HOSPITAL COURSE:  1. Acute PE. 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 On finding out that the patient would qualify for Xarelto as an outpatient, the patient was switched to Xarelto and will be continued as an outpatient on Xarelto. The patient did not have any bleeding, petechia, or thrombocytopenia during the hospital stay. His chest pain and tachycardia have resolved and on the day of discharge his blood pressure is 106/68, saturating 92% on room air, and pulse is 76. The patient is being discharged home in a stable condition.  2. The patient's coronary artery disease and chronic obstructive pulmonary disease and chronic systolic congestive heart failure have been stable during the hospital  stay and his medications will be continued at home.   DISCHARGE MEDICATIONS:  1. Omeprazole 20 mg oral once a day.  2. Advair Diskus 250/50 one puff inhaled twice a day. 3. Spiriva 18 mcg inhaled once a day.  4. Coreg 12.5 mg oral twice a day. 5. Allopurinol 100 mg oral twice a day. 6. Combivent 1 puff inhaled every six hours.  7. Flonase one spray nasal once a day as needed.  8. Gabapentin 300 mg oral once a day.  9. Lasix 40 mg oral twice a day.  10. Potassium chloride 20 mEq oral twice a day. 11. Colcrys 0.6 mg oral once a day.  12. Loratadine 10 mg oral once a day.  13. Crestor 10 mg oral once a day.  14. Amiodarone 400 mg oral twice a day. 15. Glimepiride 1 mg oral once a day.  16. Rivaroxaban 15 mg oral twice a day for 21 days followed by 20 mg oral once a day for the rest of the six months.  17. Ventolin HFA 1 puff inhaled three times daily.  DISCHARGE INSTRUCTIONS: Follow-up with primary care physician, Dr. Yves DillNeelam Khan, within a week. The patient will be a high risk for bleeding being on Xarelto and has been advised to take precautions to prevent any falls. He will be on a cardiac diet with activity as tolerated with assistance using his walker at all times. This plan was discussed with the patient who verbalized understanding and is okay with the plan.           TIME SPENT: Time spent today on this discharge dictation along with coordinating care  and counseling of the patient was 38 minutes. ____________________________ Molinda Bailiff Foye Haggart, MD srs:slb D: 08/29/2011 11:17:20 ET T: 08/29/2011 11:26:32 ET JOB#: [id number]  cc: Wardell Heath R. Elpidio Anis, MD, <Dictator> Margaretann Loveless, MD Laurier Nancy, MD Orie Fisherman MD ELECTRONICALLY SIGNED 09/05/2011 5:35

## 2014-04-25 NOTE — Discharge Summary (Signed)
DATE OF BIRTH:  1945-10-01  DISCHARGE DIAGNOSES:  1.  Acute on chronic systolic heart failure.  2.  Acute bronchitis.  3.  Mediastinal lymphadenopathy.  4.  Chronic obstructive pulmonary disease.  5.  Type 2 diabetes mellitus.  6.  Acute on chronic kidney injury.   DISCHARGE INSTRUCTIONS: DIET:  Low sodium, low fat.  ACTIVITY:  As tolerated.  FOLLOWUP:  With Dr. Ellsworth Lennox(Tejan-Sie in 1 to 2 weeks.   DISCHARGE MEDICATIONS:  1.   (Dictation Anomaly) mg daily.  2.  Loratadine 1 tablet daily.  3.  Glimepiride 1 mg once a day.  4.  Multivitamin 1 tablet daily.  5.  Enalapril 2.5 mg daily.  6.  Crestor 20 mg at bedtime.  7.  Carvedilol 3.125 mg b.i.d.  8.  Omeprazole 1 tab daily 20 mg.  9.  Furosemide 20 mg daily.  10.  Potassium chloride 10 mEq daily.  11.  Gabapentin 2 capsules once a day at bedtime.  12.  Allopurinol 100 mg b.i.d.  13.  Nitrostat 0.4 mg q. 5 minutes p.r.n.  14.  Vicodin 5/325 one tablet every 6 hours p.r.n.  15.  Enteric-coated aspirin 81 mg daily.  16.  Spironolactone unclear dosage.  17.  ProAir HFA inhaler 3 times a day.  18.  Advair Diskus 250/50 one puff inhaled b.i.d. 19.  Spiriva 18 mcg 1 cap once a day.  20.  Combivent Respimat 20/100 mcg 1 puff inhaled 4 times a day.  21.  Levaquin 250 mg daily.   HOSPITAL COURSE:  The patient was admitted through the Emergency Room complaining of increased shortness of breath and worsening leg swelling. Please refer to history and physical for full details, but the patient did present with increased weight gain, productive cough of greenish-yellow sputum. Clinical workup in the ED revealed decompensated congestive heart failure and acute bronchitis. CT scan also showed an indeterminate mediastinal lymphadenopathy. The patient was admitted to a monitored floor. He was ruled out for acute coronary syndrome. He was placed on high dose intravenous diuretics and antibiotics with high-dose steroids to treat his concomitant COPD  exacerbation. The patient's clinical status improved over his hospital stay. A pulmonology consult was placed for his mediastinal lymphadenopathy. Dr. Milta DeitersKhan's impression was most likely an infectious etiology of the mediastinal lymphadenopathy, and further evaluation could be performed as an outpatient after he completed treatment for his acute bronchitis. During hospital stay, the patient also developed acute kidney injury, for which his diuretics were decreased with improvement in his creatinine at the time of discharge. The patient exhibited clinical improvement with reduction in weight and use of baseline oxygen. He has been discharged to home with hospice in a satisfactory condition.   CONSULTATIONS:  Pulmonology.   PROCEDURES PERFORMED:  None.   TIME SPENT ON DISCHARGE:  32 minutes.  ____________________________ [name], MD sat:ms D: 12/31/2011 12:25:34 ET T: 12/31/2011 19:58:01 ET JOB#: [id number]  cc: Sheikh A. Ellsworth Lennoxejan-Sie, MD, <Dictator> Charlesetta GaribaldiSHEIKH A TEJAN-SIE MD ELECTRONICALLY SIGNED 01/02/2012 13:28

## 2014-04-25 NOTE — Discharge Summary (Signed)
PATIENT NAME:  Kevin Huynh, Kevin Huynh DATE OF BIRTH:  1945/04/15  DATE OF ADMISSION:  10/22/2011 DATE OF DISCHARGE:  10/27/2011  DISCHARGE DIAGNOSES:  1. Acute on chronic systolic congestive heart failure. 2. Diabetes mellitus.  3. Coronary artery disease.  4. Acute on chronic kidney injury.   PROCEDURE: Echocardiogram    CONSULTATION: Cardiology, Dr. Adrian BlackwaterShaukat Khan    DISCHARGE INSTRUCTIONS: Follow-up with Dr. Ellsworth Lennoxejan-Sie in 1 to 2 weeks.   DIET: Low sodium, low fat.   ACTIVITY: As tolerated.   HOSPITAL COURSE: This 69 year old male was admitted through the office where he presented with decompensated congestive heart failure with a several pound weight gain and worsening dyspnea. Please refer to the history and physical for full details.   The patient was admitted to a monitored floor. He was ruled out for acute coronary syndrome by enzymes. He was diuresed with intravenous Lasix with an excellent response subjectively and symptomatically. His chest x-ray suggested possible underlying mass in addition to the pulmonary edema. Due to his elevated creatinine from his concomitant acute on chronic kidney injury, a CT scan for further evaluation has been deferred. He will follow-up with physician as an outpatient. [name] echocardiogram revealed deterioration in his ejection fraction. His vasodilators were maximized to hopefully keep him in compensated CHF. He was discharged to home with home health nursing and physical therapy and will hopefully continue to improve from there.   DISPOSITION: Home with home health.   DISCHARGE MEDICATIONS: Lasix 40 mg daily x1 week. Please refer to discharge medical reconciliation for his resumed home medications.   DISCHARGE PROCESS TIME: 35 minutes.   ____________________________ [name], MD sat:drc D: 11/05/2011 13:10:32 ET T: 11/05/2011 13:21:59 ET JOB#: [id number]  cc: Sheikh A. Ellsworth Lennoxejan-Sie, MD, <Dictator> Charlesetta GaribaldiSHEIKH A TEJAN-SIE  MD ELECTRONICALLY SIGNED 11/07/2011 13:31

## 2014-04-25 NOTE — H&P (Signed)
PATIENT NAME:  Kevin Huynh, Kevin Huynh MR#:  [id number] DATE OF BIRTH:  July 11, 1945  DATE OF ADMISSION:  10/22/2011  ADMITTING PHYSICIAN: Bluford MainSheikh Tejan-Sie, MD  PRIMARY CARE PHYSICIAN: Bluford MainSheikh Tejan-Sie, MD  PRESENTING COMPLAINT shortness of breath.  HISTORY: This is a 69 year old Caucasian male with history of on chronic systolic congestive heart failure who complains of one month history of progressive dyspnea on exertion, paroxysmal nocturnal dyspnea, and orthopnea. The patient saw me a week ago at which point he complained of increasing pedal edema. His Lasix was increased to 40 mg twice a day. However, since then, the patient has experienced increased bipedal edema and above-mentioned symptoms. He denies noncompliance with medications, diet, alcohol binges, or ingestion of liquors. He denies any chest pain, palpitations, lightheadedness, dizziness, decreased urine output, or focal weakness. The patient was admitted to the hospital for inpatient management of his congestive heart failure.   PAST MEDICAL HISTORY:  1. Coronary artery disease, status post myocardial infarction. 2. Chronic kidney disease.  3. Chronic obstructive pulmonary disease.  4. History of proximal atrial fibrillation.  5. Hyperlipidemia.  6. Gout.  7. Last ejection fraction was 25% on 07/18/2011.   PAST SURGICAL HISTORY:  1. Coronary artery bypass graft x2 in 1980 and in the 1990s.  2. Automatic implantable cardiac defibrillator placement.   CURRENT MEDICATIONS:  1. Advair Diskus 250/50 twice a day. 2. Allopurinol 100 mg twice a day. 3. Carvedilol 3.125 mg twice a day.  4. Colcrys 0.6 mg daily.  5. Combivent Respimat 20/100 one puff four times daily. 6. Crestor 10 mg at bedtime.  7. DuoNeb nebulizer p.r.n. three times daily. 8. Ecotrin 81 mg once a day.  9. Enalapril 2.5 mg daily.  10. Flonase 50 mcg use as directed p.r.n.  11. Gabapentin 300 mg 2 daily. 12. Glimepiride 1 mg once a day.  13. Vicodin 5/325 mg every six  hours p.r.n.  14. Lasix 40 mg twice a day. 15. Loratadine 10 mg daily.  16. Nitrostat 0.6 mg sublingual p.r.n. 17. Omeprazole 20 mg 1 capsule daily.  18. Potassium chloride 20 mEq twice a day.  19. ProAir HFA inhaler four times daily.  20. Spiriva inhaler 18 mcg once daily as needed.   ALLERGIES: Advil and ibuprofen.  SOCIAL HISTORY: Denies tobacco, alcohol, or illicit drug use. Currently married. Lives with his daughter.   FAMILY HISTORY: Noncontributory.  REVIEW OF SYSTEMS: A 12-point review of systems is negative, except as in the History of Present Illness.   PHYSICAL EXAMINATION:   GENERAL: Elderly male, dyspneic at rest, not in acute distress otherwise.   VITAL SIGNS: Temperature 97.6 Fahrenheit, pulse 70 per minute, blood pressure 108/68, and oxygen saturation 92% on room air.   HEAD, EYES, EARS, NOSE, AND THROAT: Normocephalic, atraumatic. Pupils equal, round, and reactive to light and accommodation. Mild conjunctival pallor. Anicteric.   NECK: Supple. No jugular venous distention.   LUNGS: Bibasilar fine rales. No wheezes.   CARDIOVASCULAR: Heart regular. Normal heart sounds. No murmurs, third or fourth sounds.   ABDOMEN: Obese, benign to palpation.   EXTREMITIES: Bilateral 2+ to 3+ pedal edema.   CENTRAL NERVOUS SYSTEM: Awake, alert, and oriented x3, normal speech. Cranial nerves II through XII intact.   MOTOR EXAM: Normal tone, power, coordination, and reflexes globally.   IMPRESSION: A 69 year old male with acute on chronic systolic congestive heart failure, history of hypertension, chronic obstructive pulmonary disease, coronary artery disease, and diabetes.   PLAN:  1. We will admit to a monitored bed. We  will cycle cardiac enzymes to rule out acute coronary syndrome or unstable angina.  2. Congestive heart failure. We will administer intravenous Lasix. Continue low dose beta blockers and ACE inhibitors for vasodilation. We will check his BNP to assess  severity of his congestive heart failure.  3. Continue other treatments as indicated.  4. Diabetes mellitus. We will hold his oral agents and treat with sliding scale insulin.  5. Chronic kidney disease. Monitor his renal function.  6. Chronic obstructive pulmonary disease. We will resume his home metered dose inhalers.  7. Deep vein thrombosis prophylaxis with unfractionated heparin.  8. GI prophylaxis. We will continue his proton pump inhibitors.          TIME SPENT ON ADMISSION:  52 minutes with greater than 50% spent on counseling and coordination of care.  ____________________________ Silas Flood. Ellsworth Lennox, MD sat:slb D: 10/22/2011 15:48:37 ET T: 10/22/2011 16:18:41 ET JOB#: [id number]  cc: Sheikh A. Ellsworth Lennox, MD, <Dictator> Charlesetta Garibaldi MD ELECTRONICALLY SIGNED 10/24/2011 13:18

## 2014-04-25 NOTE — Consult Note (Signed)
Brief Consult Note: Diagnosis: CHF.   Comments: Kevin Huynh is well known to us with h/o CHF/cardiomyopathy and has AICD. He had increased SOB, edema and has elevated BNP and blateral pleural effusion, Patient has already been given Lasix and will repeat echo. Due to hypotension and dizziness he has been unable to tolerate higher doses of beta-blockers (Coreg).  Electronic Signatures: Radene KneeKhan, Shaukat Ali (MD)   (Signed 17-Oct-13 12:41)  Co-Signer: Brief Consult Note Ashtan Laton A (PA-C)   (Signed 17-Oct-13 11:42)  Authored: Brief Consult Note  Last Updated: 17-Oct-13 12:41 by Radene KneeKhan, Shaukat Ali (MD)

## 2014-04-25 NOTE — Consult Note (Signed)
PATIENT NAME:  Kevin Huynh, Kevin Huynh MR#:  [id number] DATE OF BIRTH:  13-Feb-1945  HISTORY AND PHYSICAL  DATE OF CONSULTATION:  08/28/2011  REFERRING PHYSICIAN:  Maricela Bo, MD, Emergency Room   CONSULTING PHYSICIAN:  Sylwia Cuervo R. Rosellen Lichtenberger, MD PRIMARY CARE PHYSICIAN: Yves Dill, MD    PRIMARY CARDIOLOGIST: Adrian Blackwater, MD     NOTE: Old records have been reviewed. EKG and imaging studies were personally reviewed. I discussed the case with the referring physician, Dr. Clemens Catholic. History was obtained from the patient.   CHIEF COMPLAINT: Chest pain and palpitations.   HISTORY OF PRESENT ILLNESS: The patient is a 69 year old Caucasian male with history of coronary artery disease, status post coronary artery bypass graft, ischemic cardiomyopathy of 25% to 35%, chronic kidney disease stage 3, chronic obstructive pulmonary disease, and left bundle branch block, presents to the Emergency Room with complaints of on and off palpitations since early a.m. today. The patient also had two hours of burning sensation in the midsternal area which was not radiating, with no aggravating or relieving factors. This was associated with palpitations. No shortness of breath, PND, orthopnea, or lower extremity swelling. The patient has a follow-up appointment with Dr. Welton Flakes on Monday in four days. There is no recent change in medications. The patient had been feeling well until early this morning.    The patient did have tachycardia showing on the EKG up to 107. Presently,  he is in the 57s. A D-dimer was checked which was elevated at 2.39. Presently, a V/Q scan has been ordered and is pending. On further discussion, the patient refuses any kind of stress test or cardiac catheterization as he had complications in the past with this but is willing to get the V/Q scan to rule out pulmonary embolism.   REVIEW OF SYSTEMS: CONSTITUTIONAL: No fever, fatigue, weakness.  EYES: No blurred vision, double vision or redness. ENT: Positive for  hearing loss. No seasonal allergies, epistaxis or dentures. RESPIRATORY: Has occasional cough with white sputum. No wheezing or hemoptysis. Positive for chronic obstructive pulmonary disease. CARDIOVASCULAR: Complains of chest pain, palpitations. No orthopnea or syncope. GASTROINTESTINAL: No nausea, vomiting, diarrhea, abdominal pain. GENITOURINARY: No dysuria or hematuria. ENDOCRINE: No polyuria, polydipsia. Positive for thyroid problems. HEMATOLOGIC/LYMPHATIC: No anemia or easy bruising. SKIN: No rash or suspicious lesions. MUSCULOSKELETAL: Limited mobility. He uses a walker to walk because of weakness, arthritis. NEUROLOGICAL: No focal neurological weakness, dysarthria, or neuropathy. PSYCHIATRIC: No history of anxiety or depression.   PAST MEDICAL HISTORY:  1. Coronary artery disease, status post coronary artery bypass graft.  2. Chronic systolic heart failure with ejection fraction 25% to 35%.  3. Chronic kidney disease, stage 3.  4. Chronic obstructive pulmonary disease.  5. Hypertension.  6. Hyperlipidemia.  7. Benign prostatic hypertrophy.  8. Gout.   PAST SURGICAL HISTORY:  1. Cataracts. 2. Defibrillator.   MEDICATIONS:  1. Advair Diskus 250/50 inhalation 1 puff b.i.d.  2. Allopurinol 100 mg oral b.i.d.  3. Amiodarone 400 mg oral b.i.d.  4. Coreg 12.5 mg oral b.i.d.  5. Colcrys 0.6 mg oral once a day.  6. Combivent inhalation 1 puff inhaled every 6 hours as needed.  7. Crestor 10 mg oral once a day.  8. Flonase 1 spray nasal once a day.  9. Gabapentin 300 mg b.i.d.  10. Glimepiride 1 mg orally once a day.  11. Lasix 40 mg oral b.i.d.  12. Loratadine 10 mg oral once a day.  13. Omeprazole 20 mg oral once a day.  14. Potassium chloride 20 mEq oral b.i.d.  15. Spiriva 18 mcg 1 capsule inhaled once a day.  16. Ventolin HFA 1 puff inhaled t.i.d. as needed.   ALLERGIES: Advil, ibuprofen.   SOCIAL HISTORY: The patient lives with his daughter. He quit smoking eight years back. No  alcohol, no illicit drugs. His wife passed away earlier this year.   FAMILY HISTORY: Positive for coronary artery disease.   PHYSICAL EXAMINATION:  VITAL SIGNS: Temperature 97.8, pulse 107, presently at 74, respirations 19, blood pressure 116/73, saturating 100% on room air.   GENERAL: A moderately built Caucasian male patient looking older than his stated age, lying comfortably in bed, conversational, cooperative with exam.   PSYCHIATRIC: Alert and oriented x3. Mood and affect appropriate. Judgment intact.   HEENT: Atraumatic, normocephalic. Oral mucosa moist and pink. External ears and nose normal. No pallor. No icterus. Pupils bilaterally equal and reactive to light.   NECK: Supple. No thyromegaly. No palpable lymph nodes. Trachea midline. No carotid bruit or JVD.   CARDIOVASCULAR: S1, S2, regular rate and rhythm without any murmurs. Peripheral pulses 2 + with 1+ edema in the lower extremities, more on the right side.   LUNGS: Clear to auscultation, normal work of breathing.   GASTROINTESTINAL: Soft abdomen, nontender. Bowel sounds present. No hepatosplenomegaly palpable. No hernias.   SKIN: Warm and dry. No petechiae, rash, or ulcers. Scars from coronary artery bypass graft.   EXTREMITIES: No clubbing, cyanosis.   NEUROLOGICAL: Motor strength five out of five in upper and lower extremities. Sensation to fine touch intact all over. Cranial nerves II through XII intact.   MUSCULOSKELETAL: No joint swelling, redness, or effusion of the large joints. Normal muscle tone.  LABORATORY, DIAGNOSTIC AND RADIOLOGICAL DATA: Laboratory studies show glucose 107, BNP 1990, BUN 23, creatinine 1.42.  WBC 5.2, hemoglobin 11.1, platelets 114. D-dimer 2.99. Chest x-ray shows chronic congestive heart failure. Ultrasound venous Dopplers of right lower extremity are negative for deep vein thrombosis.   ASSESSMENT AND PLAN:  1. Chest pain with palpitations: His complaints could be secondary to acute  coronary syndrome/chronic obstructive pulmonary disease/PE or gastroesophageal reflux disease. Presently, the first set of cardiac enzymes is normal. There are no acute ST-T wave changes. The patient has a chronic left bundle branch block. He is definitely a high risk for acute coronary syndrome considering his coronary artery disease, past coronary artery bypass graft; but the patient adamantly refuses any further stress test or cardiac catheterization. I advised to check another set of cardiac enzymes and follow up with a cardiologist if the cardiac enzymes second set is normal. The patient would have been a candidate for a stress test inpatient, but he refuses it at this time; and with the elevated D-dimer, suggest getting a V/Q scan prior to discharge to rule out pulmonary embolism. He is not tachycardic anymore, was not hypoxic. I would not start any anticoagulation without any confirmation of PE. His swelling in the right lower extremity is swelling due to surgery from a prior coronary artery bypass graft.   2. Chronic systolic congestive heart failure with ejection fraction 25% to 35%:  The patient is on aspirin, beta blocker, and Lasix. He is not on ACE inhibitor as he had problems in the past with hypotension. This should be considered by his cardiologist in the office.  3. Chronic kidney disease, stage 3: Stable.  4. Chronic mild anemia and thrombocytopenia: Stable.  5. Chronic obstructive pulmonary disease: No wheezing, stable. Continue home inhalers.  CODE STATUS:  FULL CODE.     TIME SPENT: Time spent today on this consult was 75 minutes with more than 50% time spent in coordination of care, discussing the case with the patient, ER physician, and reviewing old records.   ADDENDUM- Bilateral loer lobe pulmonary emboli -  Patients V/Q scan is intermediate probability and with elevated d-dimer. he does have Ef of 25-30% and will be admitted as inpatient for further treatment and management of  his PE with ECHO, lovenox. ____________________________ Molinda Bailiff Jesicca Dipierro, MD srs:cbb D: 08/28/2011 09:41:54 ET T: 08/28/2011 10:23:17 ET JOB#: [id number] cc: Wardell Heath R. Elpidio Anis, MD, <Dictator> Margaretann Loveless, MD Laurier Nancy, MD Orie Fisherman MD ELECTRONICALLY SIGNED 08/28/2011 14:25

## 2014-04-25 NOTE — Consult Note (Signed)
PATIENT NAME:  Kevin Huynh, Jaquarius W MR#:  [id number] DATE OF BIRTH:  1945-05-20  DATE OF CONSULTATION:  10/23/2011  REFERRING PHYSICIAN: Bluford MainSheikh Tejan-Sie, MD CONSULTING PHYSICIAN:  Adrian BlackwaterShaukat Khan, MD / Verta EllenMonica A. Billee Balcerzak, PA-C  PRIMARY CARE PHYSICIAN: Bluford MainSheikh Tejan-Sie, MD  REASON FOR CONSULTATION: Congestive heart failure, shortness of breath.   HISTORY OF PRESENT ILLNESS: Mr. Kevin CoombeRobert W. Zackery is a 69 year old white male who is well known to our office. The patient has a history of chronic systolic congestive heart failure and cardiomyopathy who had one month of progressive shortness of breath on exertion, proximal nocturnal dyspnea, and orthopnea. He has also had some increased edema in his legs despite Lasix being increased to 40 mg twice daily. The patient has been taking all his medications and his dizziness which he has had in the past has been somewhat better. He denies any chest pain and chest pressure.   PAST MEDICAL HISTORY:  1. Coronary artery disease status post myocardial infarction.  2. Chronic kidney disease.  3. Chronic obstructive pulmonary disease.  4. History of paroxysmal atrial fibrillation.  5. Hyperlipidemia.  6. Gout.  7. Congestive heart failure with cardiomyopathy, last ejection fraction was about 25 to 30% on echocardiogram done in August 2013.   PAST SURGICAL HISTORY:  1. Coronary artery bypass grafting x2 in 1980 and in the 1990s.  2. Automatic implantable cardiac defibrillator and biventricular pacer placed.  ALLERGIES: Advil and ibuprofen.   HOME MEDICATIONS:  1. Advair Diskus 250/50 inhaled twice daily.  2. Allopurinol 100 mg twice daily.  3. Carvedilol 3.125 p.o. twice daily.  4. Colcrys 0.6 mg daily.  5. Combivent Respimat 20/100 one puff four times daily. 6. Crestor 10 mg p.o. at bedtime.  7. DuoNeb nebulizer as needed up to three times daily. 8. Ecotrin 81 mg p.o. daily.  9. Enalapril 2.5 mg daily.  10. Flonase 50 mcg use as directed as needed.   11. Gabapentin 300 mg two tablets daily.  12. Glimepiride 1 mg once daily.  13. Vicodin 5/325 mg one tablet p.o. every six hours as needed. 14. Lasix 40 mg p.o. twice a day.  15. Loratadine 10 mg daily.  16. Nitrostat 0.6 mg sublingually as needed.  17. Omeprazole 20 mg one capsule daily.  18. Potassium chloride 20 mEq p.o. twice daily.  19. ProAir HVA inhaler four times daily as needed.  20. Spiriva inhaler 18 mcg once daily as needed.   SOCIAL HISTORY: The patient lives with his daughter. Denies tobacco, alcohol or illicit drug use.   FAMILY HISTORY: Noncontributory.   REVIEW OF SYSTEMS: See the history of present illness.   PHYSICAL EXAMINATION:   GENERAL: This is an elderly male with shortness of breath. He is not in any acute distress. He is lying in bed with nasal cannula.   VITAL SIGNS: Temperature 98.4 degrees Fahrenheit, heart rate 75, respiratory rate 18, blood pressure 111/64, and oxygen saturation is 95% on 2 liters of nasal cannula.   HEAD/FACE: Head atraumatic, normocephalic.   EYES: Pupils are round and equal. Conjunctivae pale, pink.   EARS AND NOSE: Normal to external inspection.  MOUTH: Poor dentition.   NECK: Supple. No carotid bruits.   PULMONARY: Lungs have basilar fine rales with decreased lung sounds bilaterally.   CARDIOVASCULAR: Regular rate and rhythm. No significant murmurs can be appreciated.   ABDOMEN: Obese. Bowel sounds present in all four quadrants. Soft. No significant tenderness.   EXTREMITIES: 2 to 3+ bilateral pedal edema.   ANCILLARY DATA: Labs -  BNP 3353. Glucose 63, BUN 29, creatinine 1.67, sodium 143, potassium 4.7, chloride 112, and CO2 23. Estimated GFR 41. Magnesium 1.8. Total protein 6.8, albumin 2.8, alkaline phosphatase 173, AST 42, and ALT 30. Total CK 40. Troponin I 0.03. White blood cell count 6.2, hemoglobin 9.9, hematocrit 29.5, and platelet count 135,000.   EKG on admission: Normal sinus rhythm, left bundle branch block,  stable compared to 08/28/2011.   Chest x-ray: Persistent densities at the right lung base, right greater than left pleural effusion, cardiomegaly, changes consistent with coronary artery bypass grafting and pacemaker.   ASSESSMENT/PLAN:  1. Shortness of breath, likely due to chronic systolic congestive heart failure in a patient with chronic obstructive pulmonary disease.  The patient does not have any elevations in his troponin I and denies any chest pain and EKG with no acute changes. The patient's last echocardiogram done in our office on 08/11/2011 with left ventricular ejection fraction between 25 to 30, mild diastolic dysfunction, and moderate tricuspid regurgitation and pulmonic regurgitation. We will need to recheck ejection fraction to make sure it is not worsening. The patient has already been given IV Lasix and is on low-dose beta blockers and ACE inhibitors. The patient has not been able to tolerate higher doses of Carvedilol or ACE due to dizziness and hypotension in the past. He already has an automatic implantable cardiac defibrillator implanted, which has been checked in February of this year. We will continue to follow this patient with you.   Thank you very much for this consultation and allowing [place] to participate in this patient's care.  ____________________________ Verta Ellen, PA-C for Adrian Blackwater, MD mam:slb D: 10/23/2011 12:53:45 ET T: 10/23/2011 13:19:01 ET JOB#: [id number]  cc: Verta Ellen, PA-C, <Dictator> Vivia Rosenburg A Winter Jocelyn PA ELECTRONICALLY SIGNED 10/24/2011 8:26

## 2014-04-25 NOTE — H&P (Signed)
PATIENT NAME:  Kevin Huynh, CIAVARELLA MR#:  [id number] DATE OF BIRTH:  1945-12-16  DATE OF ADMISSION:  12/13/2011  PRIMARY CARE PHYSICIAN:  Dr. Yves Dill from Dr. [facility] group CARDIOLOGIST:  Dr. Adrian Blackwater  CHIEF COMPLAINT: Worsening of lower leg swelling, shortness of breath and cough.   HISTORY OF PRESENT ILLNESS: The patient is a 69 year old Caucasian male with a past medical history of chronic systolic congestive heart failure with an ejection fraction of 25% as of 07/18/2011, gout, chronic obstructive pulmonary disease, lives on 2 liters of oxygen, coronary artery disease status post CABG, diabetes mellitus, and chronic renal insufficiency was sent to the ER by his Hospice nurse for worsening of lower extremity swelling and shortness of breath associated with cough. The patient is reporting that he has chronic swelling of the legs but it has been getting worse for the past two to three days, associated with worsening of exertional dyspnea. The patient also has been experiencing a productive cough with greenish-yellow phlegm for the past two to three days. Denies any fever. Denies any chest pain. Denies any abdominal pain, but feels like his abdomen is distended. Denies any nausea, vomiting, or diarrhea. No other complaints. The patient was brought to the ER by family members. CT of the chest was done by the ER physician, which has revealed congestive heart failure and bilateral pleural effusions, right greater than left, with adjacent atelectasis or consolidation. Also it has revealed indeterminate  mediastinal lymphadenopathy. The patient has received Lasix 40 mg IV and the hospitalist team was called to admit the patient. Twelve-lead EKG has revealed first-degree AV block and left bundle branch block, which is chronic as it was present  in the previous EKG from the previous admission. The patient lives on 2 liters of oxygen at home and started feeling better after he was given Lasix 40 mg IV in the  ER. He denies any other complaints.  PAST MEDICAL HISTORY: 1. Coronary artery disease status post myocardial infarction.  2. Chronic renal insufficiency, baseline creatinine is at around 1.6 to 1.7.  3. Chronic history of chronic obstructive pulmonary disease, lives on 2 liters of oxygen.  4. History of paroxysmal atrial fibrillation not on any anticoagulation.  5. Hyperlipidemia.  6. Gout   PAST SURGICAL HISTORY:  1. Coronary artery bypass grafting times two in 1980 and in 1990s.  2. Automatic implantable cardiac defibrillator placement.   HOME MEDICATION LIST:  1. Spironolactone, dose unknown. He said 1 tablet p.o. 3 times a day, which needs to be clarified. Spiriva 18-mcg inhalation once a day. 2. ProAir 1 puff inhalation 3 times a day.  3. Potassium chloride 1 tablet p.o. once a day.  4. Omeprazole 20 mg q. a.m.  5. Nitrostat 0.4 mg 1 tablet sublingually every five minutes times three as needed for chest pain. Multivitamin 1 tablet once a day.  6. Loratadine 10 mg once a day. 7. Glimepiride 1 mg p.o. once a day.  8. Gabapentin 300 mg, two capsules once a day.  9. Lasix 20 mg twice a day. 10. Enalapril 2.5 mg 1 tablet p.o. once daily.  11. Crestor 20 mg once daily.  12. Combivent 1 puff inhalation 4 times a day.  13. Colcrys 0.6 mg, 1 tablet once a day.  14. Coreg 12.5 mg, half tablet twice a day.  15. Aspirin enteric-coated 81 mg once a day. 16. Allopurinol 100 mg 2 times a day.  17. Advair 250/50 1 puff inhalation twice a day. 18. Tylenol  p.o. q. 6 hours as needed.  ALLERGIES:  The patient is allergic to Advil and ibuprofen.   PSYCHOSOCIAL HISTORY: Lives at home with his daughter. Denies any smoking, alcohol, or illicit drug usage.   FAMILY HISTORY: Mother deceased with a heart attack and father deceased with pneumonia.   REVIEW OF SYSTEMS: CONSTITUTIONAL: Denies any fever but complaining of fatigue and weight gain. Denies any pain. EYES: Denies blurry vision, glaucoma,  cataracts, or redness.  ENT: Denies any tinnitus, ear pain, hearing loss, postnasal drip, difficulty in swallowing, oropharynx redness. RESPIRATORY: Complaining of productive cough. Denies any wheezing. Denies any hemoptysis. Chronic history of chronic obstructive pulmonary disease. CARDIOVASCULAR: Denies chest pain, orthopnea, palpitations. Complaining of worsening of lower extremity edema. GI: Denies nausea, vomiting, diarrhea but complaining of abdominal fullness. Denies any hematemesis, rectal bleeding, constipation, or change in bowel habits. GU: Denies dysuria, hematuria, or renal calculi. ENDOCRINE: Denies polyuria, polyphagia, polydipsia, nocturia, or thyroid problems. Denies any increased sweating. HEMATOLOGIC/LYMPHATIC:  Patient denies any complaints of easy bruising or bleeding.  INTEGUMENT: Denies acne, rash, or lesions. MUSCULOSKELETAL: Denies any pain in the neck, back, or shoulder. Denies any arthritis. NEURO: Denies vertigo, ataxia, transient ischemic attack, seizures, or memory loss. PSYCH: The patient denies any insomnia, nervousness, or obsessive-compulsive disorder.   PHYSICAL EXAMINATION:  VITAL SIGNS: Temperature 97.6, pulse 81, respiratory rate 17 to 18, blood pressure 137/73, sating 94% on room air.   GENERAL:  Well built and well nourished, not in acute distress.   HEENT: Normocephalic, atraumatic. Pupils are equally reacting to light and accommodation. Moist mucous membranes. Tympanic membranes are intact. No ear discharge. No postnasal drip. No laryngeal edema.   NECK: Supple. No JVD. No carotid bruits. No thyromegaly.   LUNGS: Moderate air entry. Positive rhonchi, more on the right side than on the left. No crackles. No wheezing. No accessory muscle usage. No chest wall tenderness.   CARDIAC: S1, S2 normal. Regular rate and rhythm. Three to 4+ pedal edema. No bruits.  No clicks.   GI: Soft. Bowel sounds are positive in all four quadrants. Central adiposity is present.  Nontender. No masses felt.   NEUROLOGIC: Awake, alert, and oriented times three. Answering questions appropriately. Motor and sensory are grossly intact. Reflexes are 2+.   SKIN: No lesions. No rashes. No acne.   MUSCULOSKELETAL: No joint swelling, effusion, or tenderness is noticed.   PSYCH: Normal mood and affect.   LABS AND IMAGING STUDIES: 12-lead EKG has revealed a left bundle branch block and a first-degree AV block, which are chronic. CT of the chest has revealed congestive heart failure, bilateral pleural effusions, right greater than left with adjacent atelectasis or consolidation, indeterminate mediastinal lymphadenopathy. BNP is elevated at 5429, glucose 112, BUN 25, creatinine 1.59, sodium 141, potassium 4.2, chloride 110, CO2 22, GFR 44, anion gap 9, serum osmolality 286, serum calcium 8.6, total protein 7.7, albumin serum 3. Total bilirubin 0.6, alkaline phosphatase 235, AST 36, ALT 25. CK total 46,  CPK-MB 1.3. Troponin less than 0.02. WBC 5.3, hemoglobin 9.7, hematocrit 29.4, platelet count 136,000, MCV 98. PT 14.6, INR 1.1.   ASSESSMENT AND PLAN: 69 year old Caucasian male brought into the ER for worsening of pedal edema and shortness of breath associated with cough which is productive in nature. He will be admitted with the following assessment and plan.  1. Acute exacerbation of congestive heart failure, systolic in nature, with previous ejection fraction of 25% as of 07/2011:  Plan:  We will continue oxygen 2 liters via  nasal cannula. Continue aspirin, enteric coated, beta blocker, ACE inhibitor, Lasix 40 mg IV every eight hours, spironolactone 25 mg p.o. once daily. Check fasting lipid panel, CBC, and complete metabolic panel in a.m. Serial cardiac biomarkers.  12-lead EKG in a.m.  2. Acute bronchitis: We will get sputum culture and sensitivity. We will give him Levaquin 500 mg IV q. 24 hours and pharmacy to dose in view of chronic renal insufficiency.  DuoNeb nebulizer treatments  q. 6 hours.  3. Indeterminate mediastinal lymphadenopathy per CT scan of the chest which needs further evaluation and work-up by the rounding physician.  4. Chronic renal insufficiency: The patient's renal function is at baseline. His previous creatinine was at around 1.59 in April 2013. Avoid nephrotoxins.  5. Non-insulin-dependent diabetes mellitus: We will continue glimepiride, which is his home medication, and insulin sliding scale.  6. Chronic history of chronic obstructive pulmonary disease, oxygen dependent: Stable. We will continue 2 liters of oxygen. Continue DuoNeb treatments.  7. We will provide him GI prophylaxis as well as deep vein thrombosis prophylaxis.  8. As per patients wish, will continue his CODE STATUS as FULL CODE.  9. Case management consult is placed regarding discharge planning.  10. I will transfer the patient to Dr. Ellsworth Lennox, primary care physician, in a.m.   The diagnosis and plan of care was discussed in detail with the patient and daughter at bedside. They both verbalized understanding of the plan.   TIME SPENT: Total time spent on the history and physical and reviewing past medical history and coordination of care was 55 minutes.   ____________________________ Ramonita Lab, MD ag:bjt D: 12/14/2011 16:10:96 ET T: 12/14/2011 10:29:34 ET JOB#: [id number]  cc: Ramonita Lab, MD, <Dictator> Sheikh A. Ellsworth Lennox, MD Ramonita Lab MD ELECTRONICALLY SIGNED 12/15/2011 2:37

## 2014-04-28 NOTE — Consult Note (Signed)
PATIENT NAME:  Kevin Huynh, Kevin Huynh MR#:  [id number] DATE OF BIRTH:  Sep 26, 1945  DATE OF CONSULTATION:  06/22/2012  REFERRING PHYSICIAN:   CONSULTING PHYSICIAN:  Laurier NancyShaukat A. Kaiah Hosea, MD  The patient came into the hospital with increasing swelling of the legs, weight gain, lethargy and confusion and was found to be hypotensive.   HISTORY OF PRESENT ILLNESS: This is a 69 year old white male, who is very well known to me. He has history of severe LV dysfunction with ejection fraction of 20%, status post AICD implantation, diabetes mellitus and hyperlipidemia. He came into the hospital with confusion, lethargy, altered mental status and his blood pressure systolic was 60. After giving IV fluid, it became 78 systolic. He was started on a dopamine drip. Right now, his blood pressure 98 systolic.   PAST MEDICAL HISTORY: Diabetes, cardiomyopathy, ejection fraction 20% with AICD implantation, history of coronary artery disease, status post MI, history of CABG, hyperlipidemia, history of atrial fibrillation on anticoagulant, COPD and gout.   ALLERGIES: ADVIL AND IBUPROFEN.   SOCIAL HISTORY: He lives with his daughter.   FAMILY HISTORY: His mom had MI.   PHYSICAL EXAMINATION:  GENERAL: He is alert and oriented x1.   VITAL SIGNS: Temperature 101.2, pulse 102, respirations 32, blood pressure 102/83 and saturation 92%.  HEENT: Positive jugular venous distention.  LUNGS: Crackles at the bases.  HEART: Irregularly irregular pulse. Normal S1, S2. A 2/6 systolic murmur at the mitral area.  ABDOMEN: Soft, nontender, positive bowel sounds.  EXTREMITIES: No pedal edema.  NEUROLOGIC: Appears still confused, but recognizes me.  LABS: His creatinine is 2.82, BUN is 49. His troponin is 0.02. BNP is 5431. Potassium this morning, however, is 5.4. Thyroid stimulating hormone is low at 0.37. His hemoglobin is 10,  white count is 14.3 and platelet count is 134. His EKG shows sinus rhythm at 95 beats per minute and left bundle  branch block.   ASSESSMENT AND PLAN: The patient has end-stage heart failure with automatic implantable cardiac defibrillator implantation, ejection fraction 20% and appears to be in cardiogenic shock. I agree with giving dopamine. May give Dobutrex also 5 mcg. He should be made no code and be sent to hospice. I agree with giving a small dose of Lasix 20 mg intravenous b.i.d. and may repeat an echocardiogram to further evaluate his ejection fraction.   Thank you very much for the referral.  ____________________________ Laurier NancyShaukat A. Dayron Odland, MD sak:aw D: 06/22/2012 08:43:15 ET T: 06/22/2012 09:06:35 ET JOB#: [id number]  cc: Laurier NancyShaukat A. Selah Klang, MD, <Dictator> Laurier NancySHAUKAT A Mohogany Toppins MD ELECTRONICALLY SIGNED 07/13/2012 16:31

## 2014-04-28 NOTE — Op Note (Signed)
PATIENT NAME:  [name], Kevin W MR#:  [id number] DATE OF BIRTH:  May 22, 1945  DATE OF PROCEDURE:  06/22/2012  PREOPERATIVE DIAGNOSES: 1.  Cardiogenic shock requiring pressors.  2.  Poor venous access.   POSTOPERATIVE DIAGNOSES: 1.  Cardiogenic shock requiring pressors.  2.  Poor venous access.    PROCEDURES: 1.  Ultrasound guidance of fast track to the right jugular vein.  2.  Placement of the right jugular triple lumen catheter.   SURGEON: Annice NeedyJason S. Abdulrahman Bracey, M.D.   ANESTHESIA: Local.   ESTIMATED BLOOD LOSS: Minimal.   INDICATION FOR PROCEDURE: A 69 year old male admitted with cardiogenic shock on two pressors. We are asked to place a central line.   DESCRIPTION OF PROCEDURE: The patient's right neck was sterilely prepped and draped and a sterile surgical field was created. The right jugular vein was visualized on ultrasound and found to widely patent. It was then accessed under direct ultrasound guidance without difficulty with a Seldinger needle. A J-wire was placed after skin nick and dilatation. The triple lumen catheter was placed over the wire and the wire was removed and secure to the skin at approximately 18 cm with 3 silk sutures. All three lumens withdrew dark red nonpulsatile blood and flushed easily with sterile saline.    ____________________________ Annice NeedyJason S. Geneieve Duell, MD jsd:cc D: 06/22/2012 16:53:26 ET T: 06/22/2012 20:16:24 ET JOB#: [id number]  cc: Annice NeedyJason S. Edithe Dobbin, MD, <Dictator> Annice NeedyJASON S Rodney Yera MD ELECTRONICALLY SIGNED 07/01/2012 14:43

## 2014-04-28 NOTE — H&P (Signed)
PATIENT NAME:  Kevin Huynh, Kevin Huynh MR#:  [id number] DATE OF BIRTH:  [birth year]  DATE OF ADMISSION:  06/21/2012  PRIMARY CARE PHYSICIAN:  Dr. Lamonte Sakai  CARDIOLOGIST:  Dr. Neoma Laming  CHIEF COMPLAINT:  Increasing shortness of breath, leg edema, weight gain and lethargy.   History is obtained from patient's grandson, who is present in the Emergency Room. The patient is very lethargic, sleepy, unable to give any history or review of systems.   HISTORY OF PRESENT ILLNESS: Kevin Huynh is a 69 year old Caucasian gentleman with history of severe cardiomyopathy, EF of 20%, who is status post AICD placement, who is being followed by hospice at home, history of type 2 diabetes, hyperlipidemia. Comes to the Emergency Room, brought in by EMS after patient was found to have progressive decline in his mental status, with increasing lethargy, sleeping most of the time during the day, and increased leg swelling along with shortness of breath. Unable to carry out his ADLs at home. Hospice nurse came to check on him today and referred him to be brought to the Emergency Room. In the Emergency Room on arrival, systolic blood pressure was in the 60s. He got a liter of IV fluids and blood pressure during my evaluation was 78 systolic. His chest x-ray is consistent with volume overload, pulmonary edema and congestive heart failure. Given his EF of 20%, I will start him on a dopamine drip due to his hypotension and possible cardiogenic shock in the setting of congestive heart failure, acute on chronic systolic. The patient is being admitted for further evaluation and management.   PAST MEDICAL HISTORY: 1.  Type 2 diabetes.  2.  Cardiomyopathy, with EF of 20%, status post AICD in the past.  3.  Gout.  4.  History of coronary artery disease, status post MI and CABG in the past.  5.  Hyperlipidemia.  6.  History of paroxysmal AFib, not on any anticoagulation.  7.  COPD, on 2 liters oxygen.  8.  Gout.   HOME MEDICATION  LIST:  Unknown at this time, family to bring it.   ALLERGIES:  ADVIL and IBUPROFEN.   SOCIAL HISTORY:  Lives at home with his daughter. Denies any smoking, alcohol, illicit drug use.   FAMILY HISTORY:  Mother died of heart attack. Father deceased with pneumonia.   REVIEW OF SYSTEMS:  Unobtainable, since patient is very lethargic.   PHYSICAL EXAMINATION: GENERAL: The patient is lethargic, unable to barely stay awake. He moans and groans upon sternal rub.  VITAL SIGNS:  Temperature is 98.3, pulse is 91, blood pressure is 78/43, sats are 94% on 3 liters.  GENERAL:  A critically ill-appearing, elderly gentleman, who appears to be not in any acute distress.  HEENT: Atraumatic, normocephalic. PERLA. Oral mucosa is dry.  NECK:  No JVD.  No cervical lymphadenopathy.  RESPIRATORY:  Poor respiratory effort.  CARDIOVASCULAR: Both the heart sounds are normal. Rate, rhythm regular. PMI not lateralized. Chest nontender.  EXTREMITIES: There is 3+ pitting edema. Feeble pedal pulses. Could not appreciate much femoral pulses secondary to obesity.  ABDOMEN:  Obese, soft, nontender. No organomegaly. Bowel sounds could not be appreciated.  NEUROLOGIC: Again, limited secondary to lethargy. The patient moves extremities spontaneously.  SKIN:  Warm and dry.   LABS:  pH of 7.41, pCO2 is 435, pO2 of 67. White count is 14.3, H and H is 10.0 and 29.5, platelet count is 134. Glucose is 48, BUN is 49, creatinine is 2.82, sodium is 135, calcium is 8.1.  Bilirubin is 1.8, alk phos is 162, SGOT is 46, total protein is 6.1. Troponin is 0.02. B-type natriuretic peptide is 5431.   ASSESSMENT: A 69 year old Kevin Huynh with history of cardiomyopathy, ejection fraction of 20%, with automatic implantable cardiac defibrillator, coronary artery disease, is brought in for worsening pedal edema, shortness of breath and mentation. Will admit patient for:   1.  Acute on chronic exacerbation of congestive heart failure, systolic. Will  admit patient to  CCU. Will trickle slow IV fluids. Start patient on dopamine drip in the setting of cardiogenic shock and renal failure. Cardiology consultation with Dr. Neoma Laming. The patient is a full code for now, per grandson. Will give one small dose of Lasix, once blood pressure is stable. Cycle cardiac enzymes x 3.   2.  Severe cardiomyopathy, with ejection fraction of 20%, status post automatic implantable cardiac defibrillator placement.   3.  Acute encephalopathy due to #1, with poor oxygenation, volume overload, and hypoglycemia as well. Will give 1 amp of D50 stat, continue neuro checks q. 4 hourly.   4. Acute on chronic renal failure. Appears ATN, with poor perfusion, and creatinine up to 2.81. His previous creatinine was around 1.59 in April 2013. Will avoid nephrotoxins. Consider a Nephrology consultation.   5.  Insulin-dependent diabetes mellitus. Will put sliding scale insulin for now. However, patient is hypoglycemic. Will give one amp of D50 once his sugars are better. If his sugars keep dropping, then will give IV D5.  6.  Chronic history of chronic obstructive pulmonary disease, oxygen dependent. Continue oxygen at this time and DuoNeb.   7.  Gastrointestinal prophylaxis with Protonix.  8.  Deep venous thrombosis prophylaxis with heparin subcu t.i.d.   The case was discussed with Dr. Elijio Miles, who will resume care of patient in the morning.   Awaiting call from Dr. Neoma Laming.   The above was discussed with patient's grandson.   Critical time:  60 minutes.    ____________________________ Hart Rochester Posey Pronto, MD sap:mr D: 06/21/2012 20:24:00 ET T: 06/21/2012 20:53:16 ET JOB#: [id number]  cc: Perrin Maltese, MD Dionisio David, MD Zonya Gudger A. Posey Pronto, MD, <Dictator>   d Ilda Basset MD ELECTRONICALLY SIGNED 06/30/2012 14:29

## 2014-04-28 NOTE — Consult Note (Signed)
Pt now comfort care.  Consult not performed.  Electronic Signatures: Kody Brandl MPH, Rosalyn GessMichael E (MD)  (Signed on 19-Jun-14 13:32)  Authored  Last Updated: 19-Jun-14 13:32 by Mersedes Alber MPH, Rosalyn GessMichael E (MD)

## 2014-04-28 NOTE — Discharge Summary (Signed)
PATIENT NAME:  [name], [name] MR#:  [id number] DATE OF BIRTH:  1945/08/01  DATE OF ADMISSION:  06/21/2012  DATE OF DISCHARGE:  06/25/2012  PRIMARY CARE PHYSICIAN:  Dr. Ellsworth Lennoxejan-Sie   ADMITTING PHYSICIAN:  Dr. Enedina FinnerSona Patel   PROCEDURES:  2-D echocardiogram.  HOSPITAL COURSE:  This gentleman was admitted through the Emergency Room, where he presented with increasing shortness of breath, weight gain, lethargy and worsening edema of his legs. Mr. Kevin Huynh is a patient who is well-known to me, with chronic systolic congestive heart failure, cardiomyopathy, ischemic in nature. Also had AICD placed in the past. He was a hospice at home patient, but complained of increasing shortness of breath and peripheral edema, which  failed to improve after increasing his usual dose of oral diuretics. Please refer to History and Physical for details. In the Emergency Room, he was quite hypotensive despite receiving a bolus of fluids. He was admitted to the Intensive Care Unit due to his persistent hypotension with clinical congestive heart failure consistent with cardiogenic shock. In the ICU, the patient was evaluated by Cardiology. His inotropes were switched from dopamine to dobutamine, with improvement in his urine output and blood pressure. He was gradually weaned off, with maintenance of good urine output. However, a 2-D echocardiogram performed inferred his ejection fraction dropped to 10%. The patient also exhibited signs of pneumonia. His blood cultures actually grew strep pneumonia. He was placed on antibiotics. Palliative care consultation was placed, and after discussion with the family, we realized that he had end-stage heart failure, they agreed to make the patient a DNR and comfort care, especially after he exhibited acute deterioration in his mental status. The patient was transferred to the medical floor and placed on comfort care measures. The family requested that he be discharged to home with resumption of his  hospice services.   CONSULTATIONS: Cardiology, Dr. Adrian BlackwaterShaukat Khan. Palliative Care, Dr. Harvie JuniorPhifer. Nephrology, Dr. Mosetta PigeonHarmeet Singh.    DISCHARGE DIAGNOSES: 1.  Strep pneumonia sepsis. 2.  Pneumonia. 3.  Acute on chronic renal failure. 4.  Acute on chronic systolic congestive heart failure.  PROCEDURES:  2-D echocardiogram.   DISCHARGE MEDICATIONS:  Morphine 20 mg/ml oral concentrate 0.25 to 0.5 ml as needed, lorazepam 0.5 mg 1-2 tabs orally or sublingually q. 3-4 hours p.r.n. anxiety/agitation.   DISCHARGE INSTRUCTIONS:   DIET:  low sodium.   ACTIVITY: As tolerated.   FOLLOW UP:  Home with hospice care.   Discharge process:  I spent 35 minutes.    ____________________________ [name], MD sat:mr D: 07/04/2012 11:14:40 ET T: 07/04/2012 20:49:20 ET JOB#: [id number]  cc: Sheikh A. Ellsworth Lennoxejan-Sie, MD, <Dictator> Charlesetta GaribaldiSHEIKH A TEJAN-SIE MD ELECTRONICALLY SIGNED 07/05/2012 13:52

## 2014-04-28 NOTE — Consult Note (Signed)
   Comments   I met with pt's daughter and grandson. They understand that pt is approaching the end of life. We discussed changing his tx to focus on comfort and they are in agreement with this. They also agree with DNR. Orders entered. Family at bedside. Chaplain present.   Electronic Signatures: Saima Monterroso, Izora Gala (MD)  (Signed 19-Jun-14 21:25)  Authored: Palliative Care   Last Updated: 19-Jun-14 21:25 by Amere Bricco, Izora Gala (MD)

## 2014-04-30 NOTE — Consult Note (Signed)
PATIENT NAME:  Kevin Huynh, Kevin Huynh MR#:  [id number] DATE OF BIRTH:  March 15, 1945  DATE OF CONSULTATION:  04/08/2011  REFERRING PHYSICIAN:  Darliss Cheney, MD  CONSULTING PHYSICIAN:  Adrian Blackwater, MD/Kayo Zion Eloy End, PA-C   PRIMARY CARE PHYSICIAN: Bluford Main, MD   REASON FOR CONSULTATION: Supraventricular tachycardia.   HISTORY OF PRESENT ILLNESS: Kevin Huynh is a pleasant 69 year old white male who is well known to our practice. He has a history of a complicated medical history including cardiomyopathy, chronic kidney disease, congestive heart failure, coronary artery disease, diabetes mellitus, chronic anemia, gout, and history of prior myocardial infarction. The patient was at home and under extreme stress as his wife has been in the Intensive Care Unit. He noticed that his heart rate was beating rapidly and came to the ED. The patient was found to be in supraventricular tachycardia and was treated with amiodarone. He currently denies any chest pain and his heart rate has decreased and he is no longer sensing palpitations. The patient has a history of chronic shortness of breath requiring oxygen but he has not noticed any worsening shortness of breath. He also has a chronic productive cough and chronic leg edema. He notes that his leg edema has been well controlled recently.   The patient does have a history of hypotension and due to his hypotension his medications (Metoprolol and ACE inhibitors) cannot be increased in dose. During his last visit his furosemide was decreased from 40 mg twice a day to 40 mg once a day due to worsening of renal function.   PAST MEDICAL HISTORY:  1. Essential hypertension.  2. Chronic edema.  3. Hyperlipidemia.  4. Gout.  5. Diabetes mellitus.  6. Chronic obstructive pulmonary disease.  7. Chronic kidney disease.  8. Congestive heart failure with cardiomyopathy.  9. History of prior myocardial infarction.  10. Coronary artery bypass surgery in  1972. 11. Coronary artery graft stenosis in 1980 at [facility] and in the 1990's at [facility] with three vessel bypass.  12. [facility]ICD placed in 2005 at [facility]. 13. AICD battery change on 06/12/2009.   ALLERGIES: Advil/ibuprofen.   HOME MEDICATIONS:  1. Advair Diskus 250/50 inhaled twice daily.  2. Allopurinol 100 mg p.o. twice daily.  3. Colcrys 0.6 mg p.o. once daily.  4. Combivent 18/103 mcg/HCT aerosol as directed.  5. Carvedilol 12.5 mg 1 tablet p.o. twice daily.  6. Crestor 10 mg p.o. at bedtime.  7. Ecotrin low strength coated aspirin 81 mg 1 tablet p.o. daily.  8. Enalapril maleate 2.5 mg p.o. daily.  9. Flonase 50 mcg/HCT suspension use as directed.  10. Gabapentin 300 mg caps two caps once daily.  11. Glimepiride 1 mg p.o. daily.  12. Klor-Con 20 mEq once daily.  13. Furosemide 40 mg once daily.  14. Loratadine 10 mg 1 tablet p.o. daily.  15. Nitroglycerin 0.4 mg, use sublingually as needed for chest pain.  16. Omeprazole 20 mg 1 tablet p.o. q.a.m.  17. ProAir HFA inhaler use as directed.  18. Spiriva HandiHaler 18 mcg caps use as directed.   FAMILY HISTORY: Family history of heart disease and a sister with cancer.   SOCIAL HISTORY: The patient uses about one soda per day. He is not a current smoker but used to smoke in the past. Does not use any alcohol or illicit drugs.   REVIEW OF SYSTEMS: The patient complained of anxiety and stress. He has chronic productive cough. Otherwise, see the history  of present illness.   PHYSICAL EXAMINATION:   GENERAL: This is an elderly male who looks older than his stated age. He is resting comfortably in bed.   VITAL SIGNS: Temperature 98, heart rate 62, respiratory rate 16, blood pressure 86/41, oxygen sat is 98% on 1 liter nasal cannula.   HEENT: Head atraumatic, normocephalic.   EYES: Pupils are round, equal bilaterally, reactive to light. No scleral icterus. Conjunctivae pale, pink.   EARS AND  NOSE: Normal to inspection.   MOUTH: She has some dry mucous membranes.   NECK: Supple. Trachea is midline. Thyroid is smooth and mobile.   LUNGS: The patient has diminished breath sounds bilaterally. No adventitious breath sounds can be appreciated.   CARDIOVASCULAR: Regular rate and rhythm. Diminished breath sounds. No murmurs, rubs, or gallops can be appreciated.   ABDOMEN: Obese. Bowel sounds present in all four quadrants. The abdomen is soft and nontender to palpation. No rebound tenderness, guarding, or peritoneal signs.   EXTREMITIES: The patient has changes of chronic venostasis. He has trace pedal edema bilaterally.   ANCILLARY DATA: Chest x-ray has been done but results are pending.   LABORATORY DATA: BNP 3289. Glucose 123, BUN 61, creatinine 2.11, sodium 141, potassium 4.2, chloride 111, CO2 19, estimated GFR 33, total protein 7.2, albumin 3.3, total bilirubin 0.5, alkaline phosphatase 153, AST 62, ALT 42. Total CK 102. CK-MB 2.8. Troponin-I 0.02. TSH 2.21. White blood cell count 4.5, hemoglobin 10.6, hematocrit 31.6, platelet count 105. PT 13.9. INR 1.0. PTT 36.0.   ASSESSMENT/PLAN:  1. Palpitations with SVT on admission. The patient had palpitations and was found to be SVT. He was given amiodarone IV and is currently in normal sinus rhythm with heart rate 67 beats per minute. He denies any chest pain and chest pressure at this time. He had an AICD pacemaker check on 03/03/2011 which also showed an episode of SVT on 01/08/2011 that lasted 19 minutes. The patient is currently on amiodarone drip. If he continues to do well we can change the IV amiodarone to a p.o. taper.  2. Coronary artery disease. The patient has a history of coronary artery disease but denies any chest pain or chest pressure and there are no acute EKG changes. He has had a nuclear stress test in the office on 01/17/2010 with infarction in the LAD and RCA territory with no residual ischemia, dilated LV and severe LV  dysfunction.  3. Congestive heart failure with cardiomyopathy. The patient has history of AICD placement secondary to severe LV dysfunction and shortness of breath. His echocardiogram from 02/04/2011 showed severe LV systolic dysfunction, moderate diastolic dysfunction, global hypokinetic wall motion, moderate tricuspid regurgitation, mild pulmonary hypertension, trace to mild mitral regurgitation, trace aortic regurgitation.  4. Chronic obstructive pulmonary disease. The patient has chronic shortness of breath and productive cough. He is currently on inhalers and supplemental oxygen with good oxygen saturations. No adventitious breath sounds appreciated. Await chest x-ray results.  5. Diabetes mellitus. The patient has been started on insulin sliding scale.  6. Hypotension. The patient's blood pressure is low. He is currently only on amiodarone and his Lasix and carvedilol are being held.   Thank you very much for this consultation and allowing [place] to participate in this patient's care. We will continue to follow the patient with you. The case was discussed with Dr. Adrian Blackwater who agrees with the assessment and plan as mentioned above.   ____________________________ Verta Ellen, PA-C mam:drc D: 04/08/2011 10:32:23 ET T: 04/08/2011 13:14:20  ET JOB#: [id number]  cc: Verta EllenMonica A. Jonathyn Carothers, PA-C, <Dictator> Sheikh A. Ellsworth Lennoxejan-Sie, MD Spiro Ausborn A [facility] PA ELECTRONICALLY SIGNED 04/09/2011 13:52

## 2014-04-30 NOTE — Consult Note (Signed)
Brief Consult Note: Diagnosis: Palpitations with SVT reported on admission.   Comments: Patient with DM, CHF with cardiomyopathy and AICD, CAD presented with palpitations. He denies CP, chest pressure at this time and HR 67 with Amiodarone gtt. May transfer to telemetry and continue Amiodarone gtt. Will follow with you.  Electronic Signatures: Radene KneeKhan, Shaukat Ali (MD)   (Signed 02-Apr-13 13:09)  Co-Signer: Brief Consult Note Yoshi Mancillas A (PA-C)   (Signed 02-Apr-13 08:47)  Authored: Brief Consult Note  Last Updated: 02-Apr-13 13:09 by Radene KneeKhan, Shaukat Ali (MD)

## 2014-04-30 NOTE — H&P (Signed)
PATIENT NAME:  Kevin Huynh, Darry W MR#:  [id number] DATE OF BIRTH:  Apr 17, 1945  DATE OF ADMISSION:  05/28/2011  PRIMARY CARE PHYSICIAN: Dr. Yves DillNeelam Khan  PRIMARY NEPHROLOGIST: Dr. Cherylann RatelLateef   CHIEF COMPLAINT: Loss of balance and a fall.  HISTORY OF PRESENT ILLNESS:  This is a very pleasant 69 year old male with a history of chronic kidney disease stage III.  He was unlocking his front door. He lost his balance and hit his head and scraped up his arm. The hospitalist service was consulted and called for admission secondary to acute renal failure on chronic renal failure. The patient has been in her usual state of health. He denies any urinary symptoms, any shortness of breath, chest pain, or syncope.   REVIEW OF SYSTEMS:  CONSTITUTIONAL: No fever, fatigue, or weakness. EYES: No blurred or double vision. ENT: Positive hearing loss. No seasonal allergies, epistaxis, dentures.  RESPIRATORY:  No cough, wheezing, or hemoptysis.  Positive chronic obstructive pulmonary disease. CARDIOVASCULAR: No chest pain, palpitations, orthopnea, syncope, edema, arrhythmia, or dyspnea on exertion. GI: No nausea, vomiting, diarrhea, abdominal pain, melena, or ulcers.  GU: No dysuria or hematuria.  ENDOCRINE:  No polyuria or polydipsia.  Positive thyroid problems.  HEME/LYMPH:  No anemia or easy bruising. SKIN:  He has skin tears from his fall today on both arms. MUSCULOSKELETAL:  No limited activity. NEURO: No history of cerebrovascular accident, transient ischemic attack, or seizures. PSYCH: No history of anxiety or depression.   PAST MEDICAL HISTORY:  1. Chronic systolic heart failure, ejection fraction of 25-35% noted on previous echocardiogram.  2. Coronary artery disease. 3. Chronic kidney disease, stage III.  4. Chronic obstructive pulmonary disease.  5. Hypertension.  6. Hyperlipidemia.  7. Benign prostatic hypertrophy. 8. Gout.   PAST SURGICAL HISTORY: 1. Cataracts. 2. Defibrillator.  MEDICATIONS:  1. Advair  Diskus 250/50 b.i.d.  2. Allopurinol 100 b.i.d.  3. Amiodarone 400 mg b.i.d.  4. Coreg 12.5 b.i.d.  5. Colcrys 0.6 mg daily.  6. Combivent q. 6 hours p.r.n. 7. Crestor 10 mg daily. 8. Flonase one spray as needed.  9. Gabapentin 600 mg at bedtime.  10. Glimepiride 1 mg daily.  11. Lasix 40 mg b.i.d.  12. Loratadine 10 mg daily.  13. Omeprazole 20 mg daily.  14. KCl 20 mEq daily. 15. Spiriva 18 mcg daily.   ALLERGIES: Advil and ibuprofen.   SOCIAL HISTORY: The patient lives by himself.  No alcohol or IV drug use. His wife recently passed away.   FAMILY HISTORY: Positive for coronary artery disease.   PHYSICAL EXAMINATION:  VITAL SIGNS: Temperature 99.5, pulse 90, respirations 18, blood pressure was recorded at 74/56.   HEENT: Head is atraumatic. Pupils are round and reactive. Sclerae anicteric. Mucous membranes are moist.  Oropharynx is clear.  NECK: Supple without jugular venous distention, carotid bruit, or enlarged thyroid.   CARDIOVASCULAR: Regular rate and rhythm. No murmurs, gallops, or rubs. PMI is nondisplaced.   LUNGS: Clear to auscultation without crackles, rales, rhonchi, or wheezing.  ABDOMEN:  Bowel sounds positive. Nontender, nondistended.  No hepatosplenomegaly.  EXTREMITIES:  No clubbing or cyanosis. He has 1+ pitting edema bilaterally.   NEURO:  Cranial nerves II-XII are intact. No focal deficits.   SKIN:  He has multiple skin tears on his arms.   MUSCULOSKELETAL: Five out of five strength in all extremities.   LABORATORY, DIAGNOSTIC, AND RADIOLOGICAL DATA: Sodium 140, potassium 4.9, chloride 107, bicarbonate 22, BUN 89, creatinine 3.24, glucose 71. White blood cells 6.5, hemoglobin 10.5,  hematocrit 32, platelets 109. INR is 1.1. CT of the head showed no acute intracranial hemorrhage or cerebrovascular accident. CT of the cervical spine shows no acute abnormalities.   ASSESSMENT AND PLAN: 69 year old male who was unlocking his door and unfortunately had a  mechanical fall and scraped his arms, but also was noted to be in acute on chronic renal failure. 1. Acute on chronic renal failure: Probably from dehydration as the patient was also hypotensive. Hold any nephrotoxic agents. Provide some IV fluids. Also check a bladder scan. If the creatinine is not better in the morning after fluids then we will consider a nephrology consult. We will hold Lasix for now as well.  2. Hypotension: The patient likely is volume depleted. We will hold Coreg for now and monitor.  3. History of cardiovascular disease: Continue outpatient medications.  4. Hyperlipidemia: We will continue Crestor.  5. Diabetes: I will place the patient on sliding scale insulin. Hold glimepiride currently due to the acute renal failure on chronic renal failure.  6. Gastroesophageal reflux disease: Continue PPI.  7. CODE STATUS: The patient is FULL CODE STATUS.        TIME SPENT: Approximately 45 minutes.    ____________________________ Janyth Contes. Juliene Pina, MD spm:bjt D: 05/28/2011 20:40:36 ET T: 05/29/2011 09:54:56 ET JOB#: [id number]  cc: Hassie Mandt P. Juliene Pina, MD, <Dictator> Margaretann Loveless, MD Janyth Contes Andreal Vultaggio MD ELECTRONICALLY SIGNED 06/02/2011 20:51

## 2014-04-30 NOTE — Consult Note (Signed)
Evaluated records, and consult dictated, patient has severe LV dysfunction with lvef 29 percent on echo done 02/04/11, and lexiscan myoview revieled fixed anterior and inferior defect with LVEF 29 %. AICD was interogated on 03/03/11 by [employer] and had 1 episode of SVT on 01/08/11 19 beats. Advise changing to po Amio 400 bid and transfer to telemetry. This is chronic on going issue due to severe LV dysfunction.  Electronic Signatures: Radene KneeKhan, Brevan Luberto Ali (MD)  (Signed on 02-Apr-13 13:32)  Authored  Last Updated: 02-Apr-13 13:32 by Radene KneeKhan, Tien Spooner Ali (MD)

## 2014-04-30 NOTE — Discharge Summary (Signed)
PATIENT NAME:  Kevin Huynh, Kevin Huynh MR#:  [id number] DATE OF BIRTH:  [birth year]  DATE OF ADMISSION:  05/28/2011 DATE OF Kevin CoombeDISCHARGE:  05/30/2011  DISCHARGE DIAGNOSES:  1. Presyncope secondary to hypotension. 2. Fall due to hypotension, suffering with back pain. 3. History of chronic kidney disease. The patient now with acute on chronic renal failure, chronic kidney disease stage III. 4. History of coronary artery disease.  5. Chronic obstructive pulmonary disease.  6. Hypertension.  7. Hyperlipidemia. 8. 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  9. History of automatic implantable cardiac defibrillator placement.  10. History of supraventricular tachycardia. He is on amiodarone. 11. Gout.   CONSULTATIONS:  1. Cardiology consult, Dr. Adrian BlackwaterShaukat Khan. 2. Nephrology consult, Dr. Thedore MinsSingh.  DISCHARGE MEDICATIONS:  1. Allopurinol 100 mg p.o. b.i.d.  2. Combivent 1 puff every six hours as needed.  3. Flonase one spray in each nostril. 4. Gabapentin 600 mg at bedtime. 5. Colcrys 0.6 mg daily. 6. Ventolin 1 puff b.i.d. 7. Loratadine 10 mg daily  8. Crestor 10 mg daily.  9. Amaryl 1 mg daily. 10. Omeprazole 20 mg once a day.  11. Advair Diskus 250/50, 1 puff b.i.d.  12. Spiriva 1 capsule inhalation daily.  NEW MEDICATIONS:  1. Coreg is decreased from 12.5 to 6.5 b.i.d.  2. Amiodarone is also decreased from 400 mg to 200 mg daily. 3. The patient is advised to stop the Lasix until he sees nephrologist Dr. Thedore MinsSingh.   HOSPITAL COURSE: The patient is a 69 year old male with multiple medical problems of stage IV heart failure, hypertension, diabetes, and chronic obstructive pulmonary disease brought in because loss of balance and fall. Please see the History and Physical for full details.  1. The patient was found to have low blood pressure of 74/56 when he came in. He was admitted to the hospitalist service for fall secondary to hypertension and  acute on chronic renal failure. The patient was started on IV fluids and his Coreg was held.  He is monitored on telemetry. The patient also received IV fluids with normal saline 2 liters. His Lasix was also held. The patient's blood pressure improved nicely and cardiologist Dr. Adrian BlackwaterShaukat Khan saw the patient and nephrology has seen the patient. The Coreg dose was decreased from 12.5 to 6.25 b.i.d. and amiodarone is also decreased to 200 mg from 400 because of hypotensive episodes. Blood pressure has improved; this morning it is 100/55, pulse 81, and saturations 92%.  2. Acute on chronic renal failure. The patient had a creatinine of 3.24 and BUN 89 on admission. Repeat BUN and creatinine this morning showed BUN 64 and creatinine 2.46. Nephrologist Dr. Thedore MinsSingh recommended that he should be off Lasix until he sees him in the clinic. He is stable from nephrology standpoint to go home. We told the patient not to take Lasix and follow up with Dr. Thedore MinsSingh. 3. Fall. CT of the head did not show any acute changes. Chest x-ray showed persistent infiltrates noted in the lung bases, right greater than the left.  X-ray lumbar spine showed no acute osseous abnormalities. Multilevel hypertrophy present. No evidence of fracture or dislocation. Thoracic spine x-ray showed no evidence of fracture or dislocation. The patient has no evidence of acute osseous abnormality. Findings suspicious for ankylosing spondylitis. The patient is improved with Norco. We wrote a prescription for Norco for pain.  4. Troponins have been negative and he stayed asymptomatic. The patient can follow with Dr. Wynelle LinkShaukat  Welton Flakes and also Dr. Thedore Mins and follow up with his primary doctor, Dr. Yves Dill, in 1 to 2 weeks regarding the x-ray findings and follow-up of chest x-ray.          TIME SPENT ON DISCHARGE PREPARATION: More than 30 minutes.    ____________________________ Katha Hamming, MD sk:bjt D: 05/30/2011 12:28:56 ET T: 06/02/2011  10:30:42 ET JOB#: [id number]  cc: Katha Hamming, MD, <Dictator> Margaretann Loveless, MD Mosetta Pigeon, MD Laurier Nancy, MD Katha Hamming MD ELECTRONICALLY SIGNED 06/05/2011 11:14

## 2014-04-30 NOTE — H&P (Signed)
PATIENT NAME:  Kevin Huynh, Kevin Huynh MR#:  [id number] DATE OF BIRTH:  May 23, 1945  DATE OF ADMISSION:  04/08/2011  PRIMARY CARE PHYSICIAN:  Dr. Adrian BlackwaterShaukat Khan NEPHROLOGIST:  Dr. Mosetta PigeonHarmeet Singh  CHIEF COMPLAINT:  Palpitations.  HISTORY OF PRESENT ILLNESS:  This is a very pleasant 69 year old male with a complex medical cardiac history who comes in with chief complaint of palpitations earlier this evening.  The patient states that the palpitations occurred while at rest with no exacerbating factors and was otherwise comfortable at the time.  He denies any chest pain or shortness of breath or other acute systemic complaints.  No fever.  No nausea, vomiting, or diarrhea noted.  The patient states he has been compliant with his current medication list.  However, with further questioning regarding details of medications he is on, he is unable to give accurate dosages or their names.  The patient subsequently came here to the emergency room with noted supraventricular tachycardia and treated with an amiodarone bolus, which has subsequently controlled his heart rate, which now ranges in the 60s on an amiodarone drip.  PAST MEDICAL HISTORY: 1. Chronic systolic heart failure with ejection fraction of 25% to 35% noted on previous echo. 2. Coronary artery disease. 3. Chronic kidney disease stage IV. 4. Chronic obstructive pulmonary disease. 5. Hypertension. 6. Hyperlipidemia. 7. Benign prostatic hypertrophy. 8. Gout.  PAST SURGICAL HISTORY: 1. Cataracts. 2. Placement of defibrillator.  ALLERGIES:  Advil and ibuprofen.   MEDICATIONS:  With direct questioning of the patient's medications he is not able to give me the correct doses or names of his medications.  However, discharge medication list has been appreciated and as per patient there have been no changes to his medications since prior discharge back in August. 1. Allopurinol 100 mg 2 tabs daily. 2. Gabapentin 300 mg, 2-3 capsules at  bedtime. 3. Multivitamin daily. 4. Omeprazole 20 mg daily. 5. Flonase spray, one nasal spray b.i.d. 6. Advair Diskus 250/50 b.i.d. 7. Combivent inhaler 2 puffs four times daily. 8. Spiriva inhaler daily. 9. Albuterol 2 puffs q. 4 hours as needed. 10. Spironolactone 25 mg 1 tab p.o. daily. 11. Carvedilol 12.5 mg b.i.d. 12. Aspirin. 13. Glimepiride 1 mg p.o. daily. 14. Crestor. 15. Lasix 40 mg p.o. q. a.m. and with noted instructions p.r.n. for worsening leg edema, shortness of breath, or weight gain in the evening. 16. Amiodarone 400 mg p.o. b.i.d. provided on last admission back in August but am uncertain if currently taking at this time. 17. Oxygen 2 liters at night.  SOCIAL HISTORY:  The patient denies any smoking at this time.  However, noted on prior History and Physical he is a former smoker for 20 years.  Denies any alcohol or illicit drug use at this time.  FAMILY HISTORY:  Mother died at 4174 of myocardial infarction.  Father died at 1175 with diabetes and stroke as part of his past medical history.  REVIEW OF SYSTEMS:  CONSTITUTIONAL:  No fever, chills, or sweats.  Negative for weakness.  EYES:  He does not have any change in visual status at this time with no ocular pain.   EARS, NOSE, MOUTH, THROAT:  He denies any recent change to hearing with no ear pain appreciated.  CARDIOVASCULAR:  The patient states that he has noted palpitations but denies any other constitutional symptoms.  No chest pain, no shortness of breath.  RESPIRATORY:  No shortness of breath or wheezing or cough reported. ABDOMEN:  No nausea, vomiting, or diarrhea or abdominal pain  appreciated.  GENITOURINARY:  No burning on urination.  No hematuria.  MUSCULOSKELETAL:  No acute change in status with no reported joint pain or muscle pain.  SKIN:  No rashes or eruptions.  NEUROLOGIC:  Denies any motor or sensory deficits at this time.  PSYCHIATRIC:  No suicidal or homicidal ideation reported.    PHYSICAL  EXAMINATION: VITAL SIGNS:  Temperature 98, pulse 69, respirations 18, blood pressure 102/66, O2 saturation on room air 98%.    GENERAL:  Pleasant elderly male in no apparent distress, lying on the stretcher, responsive.  HEENT:  Head is normocephalic, atraumatic.  Extraocular movements intact. No icterus appreciated.  Pupils equal, round, reactive to light with no nystagmus noted.  NECK:  Supple with no jugular venous distention appreciated with moist mucous membranes.  LUNGS:  No wheezes. No rales bilaterally with fair air entry.  CARDIOVASCULAR:  S1, S2.  No gallops, rubs, or murmurs appreciated.  No bruits appreciated.  EXTREMITIES:  1+ bilateral edema appreciated.  Otherwise  full range of motion times four.  ABDOMEN:  Soft, nontender, nondistended with bowel sounds positive.  SKIN:  No rashes or lesions appreciated.  NEUROLOGIC:  Cranial nerves II-VI grossly intact with no deficit appreciated.  PSYCH:  Denies any suicidal or homicidal ideation.  LABORATORY AND RADIOLOGICAL DATA:  Glucose 123.  BNP elevated at 3289.  BUN 61.  Creatinine 2.11.  Sodium 141.  Potassium 4.2, chloride 111, bicarb 19.  Calcium 8.6. Serum total protein 7.2, albumin 3.3.  Total bilirubin 0.5.  Alkaline phosphatase 153.  AST 162.  CK 102, CK-MB 2.8, troponin 0.02.  TSH 2.21.  WBC 4.5. Hemoglobin 10.6, hematocrit 31.6, platelets 105. PT 13.9, INR 1.  PTT 36.    ASSESSMENT AND PLAN:  This is a pleasant 69 year old male with a complex cardiac medical history who comes into the emergency room with primarily a complaint of palpitations with noted nonsustained ventricular tachycardia while here in the emergency room.  The patient was subsequently bolused with amiodarone and started on an amiodarone drip, which has controlled his heart rate currently. In review of his workup and past medical history with regard to his hemoglobin and hematocrit here in the emergency room, it has been primarily stable as well as his noted  chronic kidney disease.  The patient has been kept n.p.o. for now with Dr. Welton Flakes notified that the patient is to be admitted to the Critical Care Unit  with continuation of the amiodarone drip. Blood pressure has essentially been normotensive but ranging in the 100s systolic so we will at this time hold off on any other additional cardiac medications with the exception of Lasix IV as BNP was noted to be quite elevated in the 3000s while here in the emergency room.  The patient will also be provided supplemental oxygen at this time and we will hold off on providing his current outpatient medications to include his diabetes medications and bronchodilators as well. 1. Follow up lab work ordered for this morning with CBC, comprehensive metabolic panel, A1c, and lipid profiles.  Continue the amiodarone drip at present rate.  The patient will be admitted to the Critical Care Unit  for monitoring.  We will continue IV Lasix 40 mg.  Deep vein thrombosis prophylaxis with heparin 50,000 units.  Insulin sliding scale for diabetes management for now.  PPI for GI prophylaxis. O2 support with nasal cannula. 2. N.p.o. except medications for now. 3. Cardiology consult has been placed with Dr. Adrian Blackwater informed of patient's disposition  to be transferred to the Critical Care Unit  for further monitoring and medical management.  CODE STATUS:  FULL CODE.  TIME SPENT ON ADMISSION:  One hour.   ____________________________ Lazaro Arms, MD mcv:bjt D: 04/08/2011 08:33:42 ET T: 04/08/2011 09:24:36 ET JOB#: [id number]  cc: Lazaro Arms, MD, <Dictator> Laurier Nancy, MD Lazaro Arms MD ELECTRONICALLY SIGNED 05/08/2011 21:55

## 2014-04-30 NOTE — Consult Note (Signed)
PATIENT NAME:  Kevin CoombeYNE, Kevin Huynh MR#:  [id number] DATE OF BIRTH:  Jul 11, 1945  DATE OF CONSULTATION:  05/29/2011  REFERRING PHYSICIAN:   CONSULTING PHYSICIAN:  Laurier NancyShaukat A. Kimbella Heisler, MD  INDICATION FOR CONSULTATION: History of congestive heart failure with loss of consciousness.   HISTORY OF PRESENT ILLNESS: This is a 69 year old white male who is very well known to me who came into the hospital because he fell down. He says he was feeling a little dizzy and lightheaded and lost his balance and fell down and hit his head on the floor and his hip was hurting and he bruised himself and thus came into the hospital. He is feeling much better now. He denies any chest pain, but admits to exertional shortness of breath. He has two pillow orthopnea and occasional paroxysmal nocturnal dyspnea. He says he has been taking his medications regularly, but was feeling a little dizzy and then all of a sudden he fell down.   PAST MEDICAL HISTORY:  1. History of severe [place] Heart Association class IV heart failure with severe LV dysfunction, left ventricular ejection fraction 25%.  2. History of coronary artery disease. 3. History of stage III kidney disease. 4. Chronic obstructive pulmonary disease. 5. Hypertension. 6. Hyperlipidemia. 7. Gout.  8. Automatic implantable cardiac defibrillator. He recently was also admitted with supraventricular tachycardia and was placed on amiodarone.   CURRENT MEDICATIONS:  1. Amiodarone. 2. Allopurinol. 3. Coreg 12.5 b.i.d.  4. Crestor 10 mg p.o. daily.  5. Lasix 40 b.i.d.  6. KCL 10 mEq p.o. daily.   ALLERGIES: Advil and ibuprofen.   SOCIAL HISTORY: Unremarkable.   FAMILY HISTORY: Positive for coronary artery disease.  PHYSICAL EXAMINATION:   VITAL SIGNS: He is afebrile right now. His pulse is 70, respirations 18. His blood pressure right now is 105/70.   NECK: Positive jugular venous distention.   LUNGS: Good air entry. No rales or rhonchi.   HEART: Regular rate  and rhythm. Normal S1, S2. 2/6 systolic murmur at the mitral area.   ABDOMEN: Soft, nontender, positive bowel sounds.   EXTREMITIES: 1+ pedal edema.   DIAGNOSTIC DATA: EKG apparently is not done. It is being done.   LABORATORY DATA: BUN 89, creatinine 3.24, glucose 71, chloride 107, potassium 4.9, sodium 140. His hemoglobin was 10.5. CT of the head was unremarkable.   ASSESSMENT AND PLAN: The patient has presyncope, most likely secondary to prerenal azotemia on top of his renal insufficiency. He probably needs to have his Lasix held and agree with giving IV fluid, getting renal consult and will decrease also his dosage of amiodarone and Coreg and will go up gradually on these medications, especially Coreg because of hypertension. Will work with you and also will look at his EKG.   Thank you very much for the referral.  ____________________________ Laurier NancyShaukat A. Lorelei Heikkila, MD sak:ap D: 05/29/2011 16:51:21 ET T: 05/29/2011 16:58:58 ET JOB#: [id number] cc: Laurier NancyShaukat A. Petrina Melby, MD, <Dictator> Laurier NancySHAUKAT A Faheem Ziemann MD ELECTRONICALLY SIGNED 06/16/2011 15:45

## 2014-04-30 NOTE — Discharge Summary (Signed)
PATIENT NAME:  Kevin Huynh, Kevin Huynh MR#:  [id number] DATE OF BIRTH:  October 25, 1945  DATE OF ADMISSION:  04/08/2011 DATE OF DISCHARGE:  04/09/2011  ADMITTING DIAGNOSIS: Palpitations.   DISCHARGE DIAGNOSES:  1. Palpitations due to supraventricular tachycardia, now resolved, status post Cardiology evaluation, on amiodarone.  2. Transient hypotension, improved with low dose 250 mL of fluid given. The patient's blood pressure is now better and stable.  3. Coronary artery disease.  4. History of congestive heart failure.  5. Chronic obstructive pulmonary disease.  6. Diabetes.  7. Chronic systolic heart failure with EF of 25 to 30%.  8. Status post AICD placement.  9. Benign prostatic hypertrophy.  10. Hyperlipidemia.  11. Gout.  12. Status post cataract surgery.   CONSULTANT: Dr. Welton FlakesKhan   PERTINENT LAB EVALUATIONS: Glucose 123. BNP 3289. BUN 61, creatinine 2.11, sodium 141, potassium 4.2, chloride 111, bicarb 19, calcium 8.6, total protein 7.2, albumin 3.3, alkaline phosphatase 153, AST 62. CPK 102. CK-MB 2.8. Troponin less than 0.02. TSH 2.21. WBC 4.5, hemoglobin 10.6. INR was 1.  Chest x-ray showed atelectasis in the lung base, possible pulmonary edema.   HOSPITAL COURSE: Please see history and physical done by the admitting physician. The patient is a 69 year old white male with cardiomyopathy, coronary artery disease, previous history of V. tach status post AICD who presented with palpitations. In the ED he was noted to be in SVT. The patient was given amiodarone bolus which controlled his heart rate and had to be kept on amiodarone drip. The patient was moved to the ICU and after being on amiodarone drip for short-term his heart rate went into the 60's. He was seen by his cardiologist who placed him on p.o. amiodarone. The patient also was noted to have mild hypotension. He was given 250 mL of fluid with resolution of his hypotension. His wife is currently in the CCU and he wants to be discharged.  Currently he is not short of breath and he is ambulating without any difficulty stable for discharge.   DISCHARGE MEDICATIONS:  1. Glimepiride 1 mg daily.  2. Allopurinol 100 one tab p.o. b.i.d.  3. Combivent one puff q.6 p.r.n.  4. Flonase one spray to each nostril daily.  5. Gabapentin 300 two caps daily.  6. Lasix 40 one tab p.o. b.i.d.  7. KCl 40 mEq one tab p.o. t.i.d.   8. Colchicine 0.6 one tab p.o. daily.  9. Ventolin 1 puff 3 times per day as needed.  10. Loratadine 10 daily.  11. Crestor 10 daily.  12. Omeprazole 20 daily.  13. Advair 250/50 one puff b.i.d.   14. Spiriva 18 mcg daily.  15. Carvedilol 12.5 one tab p.o. b.i.d.  16. Amiodarone 400 one tab p.o. b.i.d.   DO NOT TAKE: The patient is told not to take enalapril until seen by Dr. Welton FlakesKhan.   DIET: Low sodium.   ACTIVITY: As tolerated.   TIMEFRAME FOR FOLLOW UP: 1 to 2 weeks with Dr. Welton FlakesKhan of Cardiology.   TIME SPENT: 35 minutes.   ____________________________ Lacie ScottsShreyang H. Allena KatzPatel, MD shp:drc D: 04/09/2011 15:17:06 ET T: 04/10/2011 10:57:29 ET JOB#: [id number] cc: Yacob Wilkerson H. Allena KatzPatel, MD, <Dictator> Charise CarwinSHREYANG H Shericka Johnstone MD ELECTRONICALLY SIGNED 04/11/2011 12:17
